# Patient Record
Sex: Female | Born: 1980 | Race: Asian | Hispanic: No | Marital: Married | Smoking: Never smoker
Health system: Southern US, Community
[De-identification: ages and names within clinical notes are randomized; demographics above are authoritative.]

## PROBLEM LIST (undated history)

## (undated) ENCOUNTER — Inpatient Hospital Stay (HOSPITAL_COMMUNITY): Payer: Self-pay

## (undated) DIAGNOSIS — M778 Other enthesopathies, not elsewhere classified: Secondary | ICD-10-CM

## (undated) DIAGNOSIS — N809 Endometriosis, unspecified: Secondary | ICD-10-CM

## (undated) DIAGNOSIS — M5430 Sciatica, unspecified side: Secondary | ICD-10-CM

## (undated) DIAGNOSIS — E559 Vitamin D deficiency, unspecified: Secondary | ICD-10-CM

## (undated) DIAGNOSIS — G5600 Carpal tunnel syndrome, unspecified upper limb: Secondary | ICD-10-CM

## (undated) DIAGNOSIS — N200 Calculus of kidney: Secondary | ICD-10-CM

## (undated) DIAGNOSIS — N3010 Interstitial cystitis (chronic) without hematuria: Secondary | ICD-10-CM

## (undated) HISTORY — PX: DILATION AND CURETTAGE OF UTERUS: SHX78

---
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
## 2001-06-22 ENCOUNTER — Emergency Department (HOSPITAL_COMMUNITY): Admission: EM | Admit: 2001-06-22 | Discharge: 2001-06-22 | Payer: Self-pay

## 2001-06-22 ENCOUNTER — Encounter: Payer: Self-pay | Admitting: Emergency Medicine

## 2005-04-04 ENCOUNTER — Ambulatory Visit (HOSPITAL_COMMUNITY): Admission: RE | Admit: 2005-04-04 | Discharge: 2005-04-04 | Payer: Self-pay | Admitting: Urology

## 2005-04-04 ENCOUNTER — Encounter (INDEPENDENT_AMBULATORY_CARE_PROVIDER_SITE_OTHER): Payer: Self-pay | Admitting: Specialist

## 2005-04-04 ENCOUNTER — Ambulatory Visit (HOSPITAL_BASED_OUTPATIENT_CLINIC_OR_DEPARTMENT_OTHER): Admission: RE | Admit: 2005-04-04 | Discharge: 2005-04-04 | Payer: Self-pay | Admitting: Urology

## 2005-05-22 ENCOUNTER — Ambulatory Visit: Payer: Self-pay | Admitting: Family Medicine

## 2005-06-05 ENCOUNTER — Ambulatory Visit: Payer: Self-pay | Admitting: Internal Medicine

## 2005-06-19 ENCOUNTER — Emergency Department (HOSPITAL_COMMUNITY): Admission: EM | Admit: 2005-06-19 | Discharge: 2005-06-20 | Payer: Self-pay | Admitting: Emergency Medicine

## 2008-02-10 ENCOUNTER — Encounter: Admission: RE | Admit: 2008-02-10 | Discharge: 2008-02-10 | Payer: Self-pay | Admitting: Family Medicine

## 2008-08-07 ENCOUNTER — Inpatient Hospital Stay (HOSPITAL_COMMUNITY): Admission: AD | Admit: 2008-08-07 | Discharge: 2008-08-07 | Payer: Self-pay | Admitting: Obstetrics and Gynecology

## 2008-08-07 ENCOUNTER — Ambulatory Visit: Payer: Self-pay | Admitting: Obstetrics and Gynecology

## 2009-02-05 ENCOUNTER — Encounter (INDEPENDENT_AMBULATORY_CARE_PROVIDER_SITE_OTHER): Payer: Self-pay | Admitting: Obstetrics and Gynecology

## 2009-02-05 ENCOUNTER — Ambulatory Visit (HOSPITAL_COMMUNITY): Admission: RE | Admit: 2009-02-05 | Discharge: 2009-02-05 | Payer: Self-pay | Admitting: Obstetrics and Gynecology

## 2009-03-17 ENCOUNTER — Ambulatory Visit: Payer: Self-pay | Admitting: Oncology

## 2009-03-22 ENCOUNTER — Ambulatory Visit (HOSPITAL_COMMUNITY): Admission: RE | Admit: 2009-03-22 | Discharge: 2009-03-22 | Payer: Self-pay | Admitting: Obstetrics and Gynecology

## 2009-03-30 LAB — CBC & DIFF AND RETIC
BASO%: 1.3 % (ref 0.0–2.0)
EOS%: 1 % (ref 0.0–7.0)
HCT: 40.1 % (ref 34.8–46.6)
MCH: 30.7 pg (ref 25.1–34.0)
MCHC: 33.7 g/dL (ref 31.5–36.0)
MCV: 91.1 fL (ref 79.5–101.0)
MONO%: 6.8 % (ref 0.0–14.0)
NEUT%: 51.5 % (ref 38.4–76.8)
Retic Ct Abs: 29.04 10*3/uL (ref 18.30–72.70)
lymph#: 2.5 10*3/uL (ref 0.9–3.3)

## 2009-03-30 LAB — MORPHOLOGY
PLT EST: ADEQUATE
RBC Comments: NORMAL

## 2009-04-01 LAB — BETA-2 GLYCOPROTEIN ANTIBODIES
Beta-2-Glycoprotein I IgA: 265 U/mL — ABNORMAL HIGH (ref <=15)
Beta-2-Glycoprotein I IgM: 26 U/mL — ABNORMAL HIGH (ref <=15)

## 2009-04-01 LAB — COMPREHENSIVE METABOLIC PANEL
AST: 16 U/L (ref 0–37)
Albumin: 5.1 g/dL (ref 3.5–5.2)
BUN: 7 mg/dL (ref 6–23)
CO2: 24 mEq/L (ref 19–32)
Calcium: 9.4 mg/dL (ref 8.4–10.5)
Chloride: 103 mEq/L (ref 96–112)
Creatinine, Ser: 0.84 mg/dL (ref 0.40–1.20)
Glucose, Bld: 85 mg/dL (ref 70–99)
Potassium: 3.6 mEq/L (ref 3.5–5.3)

## 2009-04-01 LAB — LACTATE DEHYDROGENASE: LDH: 166 U/L (ref 94–250)

## 2009-04-01 LAB — LUPUS ANTICOAGULANT PANEL
PTT Lupus Anticoagulant: 44.1 secs — ABNORMAL HIGH (ref 32.0–43.4)
PTTLA 4:1 Mix: 39 secs (ref 36.3–48.8)

## 2009-04-01 LAB — SEDIMENTATION RATE: Sed Rate: 21 mm/hr (ref 0–22)

## 2009-04-01 LAB — ANA: Anti Nuclear Antibody(ANA): NEGATIVE

## 2009-11-30 ENCOUNTER — Inpatient Hospital Stay (HOSPITAL_COMMUNITY): Admission: AD | Admit: 2009-11-30 | Discharge: 2009-11-30 | Payer: Self-pay | Admitting: Obstetrics and Gynecology

## 2010-06-07 ENCOUNTER — Ambulatory Visit (HOSPITAL_COMMUNITY)
Admission: RE | Admit: 2010-06-07 | Discharge: 2010-06-07 | Payer: Self-pay | Source: Home / Self Care | Attending: Obstetrics and Gynecology | Admitting: Obstetrics and Gynecology

## 2010-07-02 ENCOUNTER — Encounter: Payer: Self-pay | Admitting: Family Medicine

## 2010-07-09 ENCOUNTER — Emergency Department (HOSPITAL_COMMUNITY)
Admission: EM | Admit: 2010-07-09 | Discharge: 2010-07-09 | Payer: Self-pay | Source: Home / Self Care | Admitting: Emergency Medicine

## 2010-07-09 LAB — URINALYSIS, ROUTINE W REFLEX MICROSCOPIC
Bilirubin Urine: NEGATIVE
Ketones, ur: NEGATIVE mg/dL
Nitrite: NEGATIVE
Protein, ur: NEGATIVE mg/dL
Specific Gravity, Urine: 1.024 (ref 1.005–1.030)
Urine Glucose, Fasting: NEGATIVE mg/dL
Urobilinogen, UA: 0.2 mg/dL (ref 0.0–1.0)

## 2010-07-09 LAB — CBC
HCT: 40.8 % (ref 36.0–46.0)
MCHC: 34.3 g/dL (ref 30.0–36.0)
MCV: 91.3 fL (ref 78.0–100.0)
Platelets: 161 10*3/uL (ref 150–400)
RDW: 12.1 % (ref 11.5–15.5)

## 2010-07-09 LAB — WET PREP, GENITAL
Clue Cells Wet Prep HPF POC: NONE SEEN
Yeast Wet Prep HPF POC: NONE SEEN

## 2010-07-09 LAB — DIFFERENTIAL
Basophils Absolute: 0.1 10*3/uL (ref 0.0–0.1)
Basophils Relative: 1 % (ref 0–1)
Eosinophils Relative: 1 % (ref 0–5)
Lymphocytes Relative: 12 % (ref 12–46)
Monocytes Absolute: 0.7 10*3/uL (ref 0.1–1.0)
Monocytes Relative: 6 % (ref 3–12)
Neutro Abs: 10.5 10*3/uL — ABNORMAL HIGH (ref 1.7–7.7)
Neutrophils Relative %: 82 % — ABNORMAL HIGH (ref 43–77)

## 2010-07-09 LAB — COMPREHENSIVE METABOLIC PANEL
ALT: 24 U/L (ref 0–35)
Alkaline Phosphatase: 126 U/L — ABNORMAL HIGH (ref 39–117)
Calcium: 9.6 mg/dL (ref 8.4–10.5)
GFR calc Af Amer: >60 mL/min (ref >60)
GFR calc non Af Amer: >60 mL/min (ref >60)
Glucose, Bld: 97 mg/dL (ref 70–99)
Total Protein: 8.5 g/dL — ABNORMAL HIGH (ref 6.0–8.3)

## 2010-07-10 LAB — GC/CHLAMYDIA PROBE AMP, GENITAL: GC Probe Amp, Genital: NEGATIVE

## 2010-09-16 LAB — CBC
Hemoglobin: 12.1 g/dL (ref 12.0–15.0)
Platelets: 311 10*3/uL (ref 150–400)
RBC: 3.78 MIL/uL — ABNORMAL LOW (ref 3.87–5.11)

## 2010-09-26 LAB — CBC
HCT: 36.7 % (ref 36.0–46.0)
Platelets: 278 10*3/uL (ref 150–400)
RDW: 12.1 % (ref 11.5–15.5)
WBC: 8.8 10*3/uL (ref 4.0–10.5)

## 2010-10-24 NOTE — Op Note (Signed)
NAMESKARLETH, Ashley Farley                    ACCOUNT NO.:  [id number]   MEDICAL RECORD NO.:  [id number]          PATIENT TYPE:  AMB   LOCATION:  SDC                           FACILITY:  [facility]   PHYSICIAN:  Lenoard Aden, M.D.DATE OF BIRTH:  Oct 16, 1980   DATE OF PROCEDURE:  02/05/2009  DATE OF DISCHARGE:  02/05/2009                               OPERATIVE REPORT   PREOPERATIVE DIAGNOSES:  1. Missed abortion at 11 weeks.  2. Habitual aborter/recurrent pregnancy loss.   POSTOPERATIVE DIAGNOSES:  1. Missed abortion at 11 weeks.  2. Habitual aborter/recurrent pregnancy loss.   PROCEDURE:  Suction dilatation and evacuation, tissue sent for  chromosomal analysis.   SURGEON:  Lenoard Aden, M.D.   ANESTHESIA:  MAC, paracervical.   ESTIMATED BLOOD LOSS:  Less than 30 mL.   COMPLICATIONS:  None.   DRAINS:  None.   COUNTS:  Correct.   The patient was taken to recovery in good condition.   BRIEF OPERATIVE NOTE:  After being apprised of risks of anesthesia,  infection, bleeding, intrauterine perforation, injury to abdominal  organs and possible need for repair, the patient was brought to the  operating room where she was administered IV sedation without  difficulty, prepped and draped in the usual sterile fashion,  catheterized till the bladder was empty.  Feet were placed in the  Yellofin stirrups.  After achieving adequate anesthesia, dilute  lidocaine solution was placed in standard paracervical block, 20 mL  total.  After achieving further anesthesia, the cervix was easily  dilated up to a #25 Pratt dilator and 8-mm suction curette placed.  Aspiration  revealed products of conception, which were visualized, collected, and  sent for chromosomal analysis.  A repeat suction and then curettage in a  four-quadrant method confirmed the cavity to be empty.  Good hemostasis  was noted.  The patient tolerated the procedure well and was transferred  to recovery in good condition.      Lenoard Aden, M.D.  Electronically Signed     RJT/MEDQ  D:  02/05/2009  T:  02/06/2009  Job:  [id number]

## 2010-11-24 ENCOUNTER — Other Ambulatory Visit (HOSPITAL_COMMUNITY): Payer: Self-pay | Admitting: Gynecology

## 2010-11-24 DIAGNOSIS — N979 Female infertility, unspecified: Secondary | ICD-10-CM

## 2010-11-28 ENCOUNTER — Ambulatory Visit (HOSPITAL_COMMUNITY)
Admission: RE | Admit: 2010-11-28 | Discharge: 2010-11-28 | Disposition: A | Discharge status: Home / Self Care | Payer: BC Managed Care – PPO | Source: Ambulatory Visit | Attending: Gynecology | Admitting: Gynecology

## 2010-11-28 DIAGNOSIS — N949 Unspecified condition associated with female genital organs and menstrual cycle: Secondary | ICD-10-CM | POA: Insufficient documentation

## 2010-11-28 DIAGNOSIS — N979 Female infertility, unspecified: Secondary | ICD-10-CM

## 2011-01-15 ENCOUNTER — Inpatient Hospital Stay (HOSPITAL_COMMUNITY): Admission: RE | Admit: 2011-01-15 | Payer: BC Managed Care – PPO | Source: Ambulatory Visit

## 2011-01-18 ENCOUNTER — Encounter (HOSPITAL_COMMUNITY)
Admission: RE | Admit: 2011-01-18 | Discharge: 2011-01-18 | Disposition: A | Discharge status: Home / Self Care | Payer: BC Managed Care – PPO | Source: Ambulatory Visit | Attending: Obstetrics and Gynecology | Admitting: Obstetrics and Gynecology

## 2011-01-18 ENCOUNTER — Encounter (HOSPITAL_COMMUNITY): Payer: Self-pay

## 2011-01-18 HISTORY — DX: Interstitial cystitis (chronic) without hematuria: N30.10

## 2011-01-18 HISTORY — DX: Calculus of kidney: N20.0

## 2011-01-18 LAB — SURGICAL PCR SCREEN
MRSA, PCR: NEGATIVE
Staphylococcus aureus: NEGATIVE

## 2011-01-18 LAB — CBC
MCHC: 33 g/dL (ref 30.0–36.0)
RDW: 12.4 % (ref 11.5–15.5)
WBC: 10.6 10*3/uL — ABNORMAL HIGH (ref 4.0–10.5)

## 2011-01-18 NOTE — Patient Instructions (Signed)
20 Ashley Farley  01/18/2011   Your procedure is scheduled on:  01/22/11  Report to [facility] at 0600 AM. Desk phone - [phone]  Call this number if you have problems the morning of surgery: [phone]   Remember:   Do not eat food:After Midnight.  Do not drink clear liquids: After Midnight.  Take these medicines the morning of surgery with A SIP OF WATER: none   Do not wear jewelry, make-up or nail polish.  Do not wear lotions, powders, or perfumes. You may wear deodorant.  Do not shave 48 hours prior to surgery.  Do not bring valuables to the hospital.  Contacts, dentures or bridgework may not be worn into surgery.  Leave suitcase in the car. After surgery it may be brought to your room.  For patients admitted to the hospital, checkout time is 11:00 AM the day of discharge.   Patients discharged the day of surgery will not be allowed to drive home.  Name and phone number of your driver: spouse- [phone]  Special Instructions: CHG Shower Use Special Wash: 1/2 bottle night before surgery and 1/2 bottle morning of surgery.   Please read over the following fact sheets that you were given:

## 2011-01-18 NOTE — Anesthesia Preprocedure Evaluation (Signed)
Anesthesia Evaluation  Name, MR# and DOB Patient awake  General Assessment Comment  Reviewed: Allergy & Precautions, H&P , Patient's Chart, lab work & pertinent test results, reviewed documented beta blocker date and time   History of Anesthesia Complications Negative for: history of anesthetic complications  Airway Mallampati: II TM Distance: >3 FB Neck ROM: full    Dental No notable dental hx.    Pulmonary  clear to auscultation  pulmonary exam normalPulmonary Exam Normal breath sounds clear to auscultation none    Cardiovascular Exercise Tolerance: Good regular Normal    Neuro/Psych Negative Neurological ROS  Negative Psych ROS  GI/Hepatic/Renal negative GI ROS, negative Liver ROS, and negative Renal ROS (+)       Endo/Other  Negative Endocrine ROS (+)      Abdominal   Musculoskeletal   Hematology negative hematology ROS (+)   Peds  Reproductive/Obstetrics negative OB ROS    Anesthesia Other Findings             Anesthesia Physical Anesthesia Plan  ASA: I  Anesthesia Plan: General   Post-op Pain Management:    Induction:   Airway Management Planned:   Additional Equipment:   Intra-op Plan:   Post-operative Plan:   Informed Consent: I have reviewed the patients History and Physical, chart, labs and discussed the procedure including the risks, benefits and alternatives for the proposed anesthesia with the patient or authorized representative who has indicated his/her understanding and acceptance.   Dental Advisory Given  Plan Discussed with: CRNA and Surgeon  Anesthesia Plan Comments:         Anesthesia Quick Evaluation

## 2011-01-22 ENCOUNTER — Encounter (HOSPITAL_COMMUNITY)
Admission: RE | Disposition: A | Discharge status: Home / Self Care | Payer: Self-pay | Source: Ambulatory Visit | Attending: Obstetrics and Gynecology

## 2011-01-22 ENCOUNTER — Ambulatory Visit (HOSPITAL_COMMUNITY): Payer: BC Managed Care – PPO | Admitting: Anesthesiology

## 2011-01-22 ENCOUNTER — Encounter (HOSPITAL_COMMUNITY): Payer: Self-pay | Admitting: Anesthesiology

## 2011-01-22 ENCOUNTER — Ambulatory Visit (HOSPITAL_COMMUNITY)
Admission: RE | Admit: 2011-01-22 | Discharge: 2011-01-22 | Disposition: A | Discharge status: Home / Self Care | Payer: BC Managed Care – PPO | Source: Ambulatory Visit | Attending: Obstetrics and Gynecology | Admitting: Obstetrics and Gynecology

## 2011-01-22 DIAGNOSIS — N803 Endometriosis of pelvic peritoneum, unspecified: Secondary | ICD-10-CM | POA: Insufficient documentation

## 2011-01-22 DIAGNOSIS — N979 Female infertility, unspecified: Secondary | ICD-10-CM | POA: Insufficient documentation

## 2011-01-22 DIAGNOSIS — Z01818 Encounter for other preprocedural examination: Secondary | ICD-10-CM | POA: Insufficient documentation

## 2011-01-22 DIAGNOSIS — N801 Endometriosis of ovary: Secondary | ICD-10-CM | POA: Insufficient documentation

## 2011-01-22 DIAGNOSIS — Z01812 Encounter for preprocedural laboratory examination: Secondary | ICD-10-CM | POA: Insufficient documentation

## 2011-01-22 DIAGNOSIS — N946 Dysmenorrhea, unspecified: Secondary | ICD-10-CM | POA: Insufficient documentation

## 2011-01-22 DIAGNOSIS — N80109 Endometriosis of ovary, unspecified side, unspecified depth: Secondary | ICD-10-CM | POA: Insufficient documentation

## 2011-01-22 HISTORY — PX: LAPAROSCOPY: SHX197

## 2011-01-22 LAB — HCG, QUANTITATIVE, PREGNANCY: hCG, Beta Chain, Quant, S: 1 m[IU]/mL (ref <5)

## 2011-01-22 SURGERY — LAPAROSCOPY, DIAGNOSTIC
Anesthesia: General

## 2011-01-22 MED ORDER — PROPOFOL 10 MG/ML IV EMUL
INTRAVENOUS | Status: DC | PRN
  Administered 2011-01-22: 150 mg via INTRAVENOUS

## 2011-01-22 MED ORDER — FENTANYL CITRATE 0.05 MG/ML IJ SOLN: 25.0000 ug | INTRAMUSCULAR | Status: DC | PRN

## 2011-01-22 MED ORDER — HYDROMORPHONE HCL 2 MG PO TABS: 2.0000 mg | ORAL_TABLET | ORAL | Status: AC | PRN

## 2011-01-22 MED ORDER — KETOROLAC TROMETHAMINE 30 MG/ML IJ SOLN
INTRAMUSCULAR | Status: AC
  Filled 2011-01-22: qty 1

## 2011-01-22 MED ORDER — NEOSTIGMINE METHYLSULFATE 1 MG/ML IJ SOLN
INTRAMUSCULAR | Status: DC | PRN
  Administered 2011-01-22: 3 mg via INTRAMUSCULAR

## 2011-01-22 MED ORDER — BUPIVACAINE HCL (PF) 0.25 % IJ SOLN
INTRAMUSCULAR | Status: DC | PRN
  Administered 2011-01-22: 10 mL

## 2011-01-22 MED ORDER — LACTATED RINGERS IV SOLN
INTRAVENOUS | Status: DC
  Administered 2011-01-22 (×2): via INTRAVENOUS

## 2011-01-22 MED ORDER — MIDAZOLAM HCL 5 MG/5ML IJ SOLN
INTRAMUSCULAR | Status: DC | PRN
  Administered 2011-01-22: 2 mg via INTRAVENOUS

## 2011-01-22 MED ORDER — CLINDAMYCIN PHOSPHATE 900 MG/50ML IV SOLN
900.0000 mg | Freq: Once | INTRAVENOUS | Status: AC
  Administered 2011-01-22: 900 mg via INTRAVENOUS
  Filled 2011-01-22: qty 50

## 2011-01-22 MED ORDER — KETOROLAC TROMETHAMINE 30 MG/ML IJ SOLN: 15.0000 mg | Freq: Once | INTRAMUSCULAR | Status: DC | PRN

## 2011-01-22 MED ORDER — LIDOCAINE HCL (CARDIAC) 20 MG/ML IV SOLN
INTRAVENOUS | Status: AC
  Filled 2011-01-22: qty 5

## 2011-01-22 MED ORDER — FENTANYL CITRATE 0.05 MG/ML IJ SOLN
INTRAMUSCULAR | Status: AC
  Filled 2011-01-22: qty 5

## 2011-01-22 MED ORDER — KETOROLAC TROMETHAMINE 30 MG/ML IJ SOLN
INTRAMUSCULAR | Status: DC | PRN
  Administered 2011-01-22: 30 mg via INTRAVENOUS

## 2011-01-22 MED ORDER — ONDANSETRON HCL 4 MG/2ML IJ SOLN
INTRAMUSCULAR | Status: DC | PRN
  Administered 2011-01-22: 4 mg via INTRAVENOUS

## 2011-01-22 MED ORDER — ONDANSETRON HCL 4 MG/2ML IJ SOLN
INTRAMUSCULAR | Status: AC
  Filled 2011-01-22: qty 2

## 2011-01-22 MED ORDER — MIDAZOLAM HCL 2 MG/2ML IJ SOLN
INTRAMUSCULAR | Status: AC
  Filled 2011-01-22: qty 2

## 2011-01-22 MED ORDER — GLYCOPYRROLATE 0.2 MG/ML IJ SOLN
INTRAMUSCULAR | Status: AC
  Filled 2011-01-22: qty 2

## 2011-01-22 MED ORDER — PROMETHAZINE HCL 25 MG/ML IJ SOLN: 6.2500 mg | INTRAMUSCULAR | Status: DC | PRN

## 2011-01-22 MED ORDER — LIDOCAINE HCL (CARDIAC) 20 MG/ML IV SOLN
INTRAVENOUS | Status: DC | PRN
  Administered 2011-01-22: 50 mg via INTRAVENOUS

## 2011-01-22 MED ORDER — ROCURONIUM BROMIDE 100 MG/10ML IV SOLN
INTRAVENOUS | Status: DC | PRN
  Administered 2011-01-22: 30 mg via INTRAVENOUS

## 2011-01-22 MED ORDER — ACETAMINOPHEN 325 MG PO TABS: 325.0000 mg | ORAL_TABLET | ORAL | Status: DC | PRN

## 2011-01-22 MED ORDER — PROPOFOL 10 MG/ML IV EMUL
INTRAVENOUS | Status: AC
  Filled 2011-01-22: qty 50

## 2011-01-22 MED ORDER — NEOSTIGMINE METHYLSULFATE 1 MG/ML IJ SOLN
INTRAMUSCULAR | Status: AC
  Filled 2011-01-22: qty 10

## 2011-01-22 MED ORDER — ROCURONIUM BROMIDE 50 MG/5ML IV SOLN
INTRAVENOUS | Status: AC
  Filled 2011-01-22: qty 1

## 2011-01-22 MED ORDER — FENTANYL CITRATE 0.05 MG/ML IJ SOLN
INTRAMUSCULAR | Status: DC | PRN
  Administered 2011-01-22: 25 ug via INTRAVENOUS
  Administered 2011-01-22: 100 ug via INTRAVENOUS

## 2011-01-22 MED ORDER — GLYCOPYRROLATE 0.2 MG/ML IJ SOLN
INTRAMUSCULAR | Status: DC | PRN
  Administered 2011-01-22: .6 mg via INTRAVENOUS

## 2011-01-22 SURGICAL SUPPLY — 25 items
APPLICATOR COTTON TIP 6IN STRL (MISCELLANEOUS) ×4 IMPLANT
CABLE HIGH FREQUENCY MONO STRZ (ELECTRODE) IMPLANT
CATH ROBINSON RED A/P 16FR (CATHETERS) ×2 IMPLANT
CHLORAPREP W/TINT 26ML (MISCELLANEOUS) ×4 IMPLANT
CLOTH BEACON ORANGE TIMEOUT ST (SAFETY) ×2 IMPLANT
DERMABOND ADVANCED (GAUZE/BANDAGES/DRESSINGS) ×2 IMPLANT
GLOVE BIO SURGEON STRL SZ 6.5 (GLOVE) ×2 IMPLANT
GLOVE BIOGEL PI IND STRL 7.0 (GLOVE) ×2 IMPLANT
GLOVE BIOGEL PI INDICATOR 7.0 (GLOVE) ×2
GOWN PREVENTION PLUS LG XLONG (DISPOSABLE) ×4 IMPLANT
NS IRRIG 1000ML POUR BTL (IV SOLUTION) ×2 IMPLANT
PACK LAPAROSCOPY BASIN (CUSTOM PROCEDURE TRAY) ×2 IMPLANT
SEALER TISSUE G2 CVD JAW 35 (ENDOMECHANICALS) IMPLANT
SEALER TISSUE G2 CVD JAW 45CM (ENDOMECHANICALS) IMPLANT
SET IRRIG TUBING LAPAROSCOPIC (IRRIGATION / IRRIGATOR) IMPLANT
SLEEVE Z-THREAD 5X100MM (TROCAR) IMPLANT
STRIP CLOSURE SKIN 1/2X4 (GAUZE/BANDAGES/DRESSINGS) ×2 IMPLANT
SUT VIC AB 3-0 PS2 18 (SUTURE) ×2
SUT VIC AB 3-0 PS2 18XBRD (SUTURE) ×1 IMPLANT
SUT VICRYL 0 UR6 27IN ABS (SUTURE) ×2 IMPLANT
TOWEL OR 17X24 6PK STRL BLUE (TOWEL DISPOSABLE) ×4 IMPLANT
TROCAR Z-THREAD BLADED 5X100MM (TROCAR) ×2 IMPLANT
TROCAR Z-THREAD FIOS 11X100 BL (TROCAR) ×2 IMPLANT
WARMER LAPAROSCOPE (MISCELLANEOUS) ×2 IMPLANT
WATER STERILE IRR 1000ML POUR (IV SOLUTION) ×2 IMPLANT

## 2011-01-22 NOTE — Anesthesia Postprocedure Evaluation (Signed)
  Anesthesia Post Note  Patient: Ashley Farley  Procedure(s) Performed:  LAPAROSCOPY DIAGNOSTIC  Anesthesia type: GA  Patient location: PACU  Post pain: Pain level controlled  Post assessment: Post-op Vital signs reviewed  Last Vitals:  Filed Vitals:   01/22/11 0619  BP: 93/56  Pulse: 59  Temp: 98.1 F (36.7 C)  Resp: 16    Post vital signs: Reviewed  Level of consciousness: sedated  Complications: No apparent anesthesia complications

## 2011-01-22 NOTE — H&P (Signed)
30 yo G4P0 with dysmenorrhea and infertility presents for diagnostic laparoscopy  PMHx/PSHx:  Neg All:  Amoxicillin, hydrocodone FHx:  N/C Meds: none  AF, VSS Gen:  NAD CV:  RRR Lungs:  CTAB Abd: soft, NT PV: neg for masses  HSG:  WNL  A/P:  Dysmenorrhea, Infertility Plan for diagnostic Laparoscopy.  R/B/A d/w pt.  Informed consent

## 2011-01-22 NOTE — Transfer of Care (Signed)
Immediate Anesthesia Transfer of Care Note  Patient: Ashley Farley  Procedure(s) Performed:  LAPAROSCOPY DIAGNOSTIC  Patient Location: PACU  Anesthesia Type: General  Level of Consciousness: awake, alert  and sedated  Airway & Oxygen Therapy: Patient Spontanous Breathing and Patient connected to nasal cannula oxygen  Post-op Assessment: Report given to PACU RN and Post -op Vital signs reviewed and stable  Post vital signs: Reviewed and stable  Complications: No apparent anesthesia complications

## 2011-01-22 NOTE — Anesthesia Procedure Notes (Signed)
Procedure Name: Intubation Performed by: Madison Hickman Pre-anesthesia Checklist: Patient identified, Emergency Drugs available, Suction available, Patient being monitored and Timeout performed Patient Re-evaluated:Patient Re-evaluated prior to inductionOxygen Delivery Method: Circle System Utilized Preoxygenation: Pre-oxygenation with 100% oxygen Intubation Type: IV induction Ventilation: Mask ventilation without difficulty Laryngoscope Size: Mac and 3 Grade View: Grade I Tube type: Oral Tube size: 7.0 mm Number of attempts: 1 Airway Equipment and Method: stylet Placement Confirmation: ETT inserted through vocal cords under direct vision,  breath sounds checked- equal and bilateral and positive ETCO2 Secured at: 22 cm Tube secured with: Tape Dental Injury: Teeth and Oropharynx as per pre-operative assessment

## 2011-01-22 NOTE — Op Note (Signed)
Diagnostic Laparoscopy Procedure Note  Indications: The patient is a 30 y.o. female with dysmenorrhea and infertility.  Pre-operative Diagnosis: dysmenorrhea  Post-operative Diagnosis: endometriosis  Surgeon: Amanii Snethen   Anesthesia: General endotracheal anesthesia  Procedure:  Diagnostic laparoscopy, lysis of adhesions, fulgeration of endometriosis  Procedure Details  The patient was seen in the Holding Room. The risks, benefits, complications, treatment options, and expected outcomes were discussed with the patient. The possibilities of reaction to medication, pulmonary aspiration, perforation of viscus, bleeding, recurrent infection, the need for additional procedures, failure to diagnose a condition, and creating a complication requiring transfusion or operation were discussed with the patient. The patient concurred with the proposed plan, giving informed consent. The patient was taken to the Operating Room, identified as Ashley Farley and the procedure verified as Diagnostic Laparoscopy. A Time Out was held and the above information confirmed.  After induction of general anesthesia, the patient was placed in modified dorsal lithotomy position where she was prepped, draped, and catheterized in the normal, sterile fashion.  The cervix was visualized and an intrauterine manipulator was placed. 5cc of Marcaine was used to inject local anesthesia at the site of both incisions.  An infraumbilical skin incision was then performed and this was disected bluntly to the fascia with a kelly clamp.  Optical trocar was then inserted and CO2 was turned on once intraperitoneal placement was confirmed.  A survey of the abdomen and pelvis revealed a normal RUQ and appendix.  5mm incision was then made 2cm above the pubic bone and a 5mm trocar was inserted under direct visualization.     Normal appearing right adnexa and uterus.  She had a small brown lesion of endometriosis in the right ovarian fossa.   Left fallopian tube appeared normal.  The left ovary had thin, filmy adhesions to the ovarian fossa that were freed with blunt disection.  Lesions of endometriosis were also identified along the peritoneum over the left ureter and right vesicouterine peritoneum.  The lesions in the right & left ovarian fossa were cauterized with the bipolar.  The lesions along the peritoneum overlying the left ureter were not cauterized.     The incision was closed with subcutaneous and subcuticular sutures of 4-0 Vicryl.  Dermabond was placed over incisions.  The intrauterine manipulator was then removed.  Instrument, sponge, and needle counts were correct prior to abdominal closure and at the conclusion of the case.   Estimated Blood Loss:  Minimal         Specimens: none              Complications:  None; patient tolerated the procedure well.         Disposition: PACU - hemodynamically stable.         Condition: stable

## 2011-02-19 ENCOUNTER — Encounter (HOSPITAL_COMMUNITY): Payer: Self-pay | Admitting: Obstetrics and Gynecology

## 2013-06-24 ENCOUNTER — Encounter: Payer: Self-pay | Admitting: Obstetrics and Gynecology

## 2013-06-24 LAB — US OB COMP LESS 14 WKS

## 2013-07-07 ENCOUNTER — Encounter (HOSPITAL_COMMUNITY): Payer: Self-pay

## 2013-07-07 ENCOUNTER — Ambulatory Visit (HOSPITAL_COMMUNITY)
Admission: RE | Admit: 2013-07-07 | Discharge: 2013-07-07 | Disposition: A | Discharge status: Home / Self Care | Payer: BC Managed Care – PPO | Source: Ambulatory Visit | Attending: Obstetrics and Gynecology | Admitting: Obstetrics and Gynecology

## 2013-07-07 DIAGNOSIS — O30009 Twin pregnancy, unspecified number of placenta and unspecified number of amniotic sacs, unspecified trimester: Secondary | ICD-10-CM | POA: Insufficient documentation

## 2013-07-07 DIAGNOSIS — O30049 Twin pregnancy, dichorionic/diamniotic, unspecified trimester: Secondary | ICD-10-CM | POA: Insufficient documentation

## 2013-07-07 DIAGNOSIS — O2620 Pregnancy care for patient with recurrent pregnancy loss, unspecified trimester: Secondary | ICD-10-CM

## 2013-07-07 DIAGNOSIS — M5430 Sciatica, unspecified side: Secondary | ICD-10-CM | POA: Insufficient documentation

## 2013-07-07 NOTE — Consult Note (Signed)
MFM consult  33 yr old G6P0050 at 9+ weeks with dichorionic/diamniotic twin gestation referred by Dr. Su Hiltoberts for consultation. This pregnancy was conceived via IVF. 2 embryos were placed with a frozen cycle. Patient had preimplantation genetic screening done which was normal. Had one episode of bleeding at 8 weeks. Currently has nausea. Also reports sciatic nerve pain.   Past Ob hx: 5 first trimester miscarriages- reports never had heart beat Past gyn hx: endometriosis; D&Cx2 PMH: interstitial cystitis, renal stones PSH: laparoscopy; D&C x2 Medications: PNV, crinone, aspirin, PNV, folic acid Allergies: oxycodone- rash, amoxicillin- rash Social history: negative Family history: parents with diabetes  I counseled the patient as follows: 1. Twin pregnancy: - discussed increased risk of miscarriage, preterm labor/PPROM/preterm cervical dilation, and preterm delivery- average age of delivery is 6635 weeks with dichorionic twins - recommend preterm labor precautions; consider obtaining cervical length at time of anatomy survey - discussed increased risk of fetal growth restriction- recommend fetal growth every 4 weeks starting at time of anatomic survey - discussed increased risk of maternal complications including gestational diabetes and preeclampsia as well as increased risk for need for C section - recommend close surveillance for development of signs/symptoms of preeclampsia 2. Recurrent pregnancy loss: - patient has received an extensive work up including uterine evaluation, thrombophilia work up, and karyotype of missed abortion- work up has been negative - given no clear etiology difficult to give risk of pregnancy loss - discussed risks decrease with advancing gestational age 823. Strong family history of diabetes: - recommend early glucola screen in the first trimester; if normal recommend repeat at 26-[redacted] weeks gestation - discussed increased risk of gestational diabetes with family  history and twin gestation 514. Discussed aneuploidy screening: - patient had PGS and declines further screening 5. Recommend a detailed fetal anatomic survey at 18-[redacted] weeks gestation 6. Recommend delivery at 38 weeks or sooner if clinically indicated 7. Nausea: - has not taken zofran - recommend trial of diclegis- prescription given - may take 2 tabs at bedtime - if symptoms persist during the day may add one tab in the morning and increase to 2 tabs bid as needed 8. Management of progesterone and aspirin per REI; patient also received lipid infusions during this pregnancy per REI  I spent a total of 30 minutes of which 100% was in face to face consultation with the patient discussing the above.  Eulis FosterKristen Inga Noller, MD

## 2013-07-08 NOTE — Addendum Note (Signed)
Encounter addended by: Yoshiaki Kreuser E Marigny Borre, RN on: 07/08/2013 11:51 AM<BR>     Documentation filed: Charges VN

## 2013-07-09 ENCOUNTER — Other Ambulatory Visit: Payer: Self-pay | Admitting: Obstetrics and Gynecology

## 2013-07-09 ENCOUNTER — Ambulatory Visit (HOSPITAL_COMMUNITY)
Admission: RE | Admit: 2013-07-09 | Discharge: 2013-07-09 | Disposition: A | Discharge status: Home / Self Care | Payer: BC Managed Care – PPO | Source: Ambulatory Visit | Attending: Obstetrics and Gynecology | Admitting: Obstetrics and Gynecology

## 2013-07-09 DIAGNOSIS — O209 Hemorrhage in early pregnancy, unspecified: Secondary | ICD-10-CM

## 2013-07-09 DIAGNOSIS — Z3689 Encounter for other specified antenatal screening: Secondary | ICD-10-CM | POA: Insufficient documentation

## 2013-07-09 DIAGNOSIS — O30009 Twin pregnancy, unspecified number of placenta and unspecified number of amniotic sacs, unspecified trimester: Secondary | ICD-10-CM | POA: Insufficient documentation

## 2013-09-09 ENCOUNTER — Other Ambulatory Visit: Payer: Self-pay | Admitting: Obstetrics and Gynecology

## 2013-09-09 ENCOUNTER — Ambulatory Visit (HOSPITAL_COMMUNITY)
Admission: RE | Admit: 2013-09-09 | Discharge: 2013-09-09 | Disposition: A | Discharge status: Home / Self Care | Payer: BC Managed Care – PPO | Source: Ambulatory Visit | Attending: Obstetrics and Gynecology | Admitting: Obstetrics and Gynecology

## 2013-09-09 DIAGNOSIS — O9989 Other specified diseases and conditions complicating pregnancy, childbirth and the puerperium: Secondary | ICD-10-CM

## 2013-09-09 DIAGNOSIS — R109 Unspecified abdominal pain: Secondary | ICD-10-CM

## 2013-09-09 DIAGNOSIS — R1031 Right lower quadrant pain: Secondary | ICD-10-CM | POA: Insufficient documentation

## 2013-09-09 DIAGNOSIS — O4410 Placenta previa with hemorrhage, unspecified trimester: Secondary | ICD-10-CM

## 2013-09-09 DIAGNOSIS — O30009 Twin pregnancy, unspecified number of placenta and unspecified number of amniotic sacs, unspecified trimester: Secondary | ICD-10-CM | POA: Insufficient documentation

## 2013-09-09 DIAGNOSIS — O99891 Other specified diseases and conditions complicating pregnancy: Secondary | ICD-10-CM | POA: Insufficient documentation

## 2013-11-26 ENCOUNTER — Encounter (HOSPITAL_COMMUNITY): Payer: Self-pay | Admitting: *Deleted

## 2013-11-26 ENCOUNTER — Inpatient Hospital Stay (HOSPITAL_COMMUNITY)
Admission: AD | Admit: 2013-11-26 | Discharge: 2013-11-26 | Disposition: A | Discharge status: Home / Self Care | Payer: BC Managed Care – PPO | Source: Ambulatory Visit | Attending: Obstetrics and Gynecology | Admitting: Obstetrics and Gynecology

## 2013-11-26 DIAGNOSIS — O30049 Twin pregnancy, dichorionic/diamniotic, unspecified trimester: Secondary | ICD-10-CM | POA: Insufficient documentation

## 2013-11-26 DIAGNOSIS — IMO0001 Reserved for inherently not codable concepts without codable children: Secondary | ICD-10-CM | POA: Diagnosis present

## 2013-11-26 DIAGNOSIS — O26879 Cervical shortening, unspecified trimester: Secondary | ICD-10-CM | POA: Diagnosis present

## 2013-11-26 DIAGNOSIS — O09819 Supervision of pregnancy resulting from assisted reproductive technology, unspecified trimester: Secondary | ICD-10-CM | POA: Insufficient documentation

## 2013-11-26 DIAGNOSIS — Z8742 Personal history of other diseases of the female genital tract: Secondary | ICD-10-CM

## 2013-11-26 DIAGNOSIS — O30009 Twin pregnancy, unspecified number of placenta and unspecified number of amniotic sacs, unspecified trimester: Secondary | ICD-10-CM | POA: Diagnosis not present

## 2013-11-26 DIAGNOSIS — N200 Calculus of kidney: Secondary | ICD-10-CM | POA: Diagnosis not present

## 2013-11-26 DIAGNOSIS — N883 Incompetence of cervix uteri: Secondary | ICD-10-CM | POA: Diagnosis present

## 2013-11-26 HISTORY — DX: Sciatica, unspecified side: M54.30

## 2013-11-26 HISTORY — DX: Carpal tunnel syndrome, unspecified upper limb: G56.00

## 2013-11-26 MED ORDER — BETAMETHASONE SOD PHOS & ACET 6 (3-3) MG/ML IJ SUSP
12.0000 mg | INTRAMUSCULAR | Status: DC
  Administered 2013-11-26: 12 mg via INTRAMUSCULAR
  Filled 2013-11-26: qty 2

## 2013-11-26 NOTE — MAU Provider Note (Signed)
History   33 yo G1P0 at 4830 4/7 weeks with di/di twins, hx IVF pregnancy, with short cervix on US today, 1.36 cm.  Cervix closed by exam.  Cultures and GBS done in office.  No FFN done due to prior transvaginal US.  Reports mild pressure sensations today, no bleeding or leaking, reports +FM.  US today showed Twin A vtx, EFW 1590 gm, Twin B transverse, 1707 gm, with cervix 1.36 cm long.  Here for monitoring and initiation of betamethasone course.  Patient Active Problem List   Diagnosis Date Noted  . Twins--di/di 11/26/2013  . Short cervix--1.36 on US today 11/26/2013  . Kidney stones--hx 11/26/2013  . H/O infertility--IVF pregnancy 11/26/2013    Chief Complaint  Patient presents with  . Labor Eval   HPI  OB History   Grav Para Term Preterm Abortions TAB SAB Ect Mult Living   1               Past Medical History  Diagnosis Date  . Interstitial cystitis   . Kidney stones     Past Surgical History  Procedure Laterality Date  . Dilation and curettage of uterus    . Laparoscopy  01/22/2011    Procedure: LAPAROSCOPY DIAGNOSTIC;  Surgeon: Zelphia CairoGretchen Adkins;  Location: [facility];  Service: Gynecology;  Laterality: N/A;    No family history on file.  History  Substance Use Topics  . Smoking status: Never Smoker   . Smokeless tobacco: Not on file  . Alcohol Use: Yes     Comment: socially    Allergies:  Allergies  Allergen Reactions  . Amoxicillin Nausea And Vomiting and Rash  . Vicodin [Hydrocodone-Acetaminophen] Nausea And Vomiting and Rash    Prescriptions prior to admission  Medication Sig Dispense Refill  . benzonatate (TESSALON) 100 MG capsule Take 100-200 mg by mouth 3 (three) times daily as needed. As needed for cough        . Cetirizine HCl (ZYRTEC ALLERGY PO) Take 1 tablet by mouth every other day. For allergy       . Misc Natural Products (COLON CLEANSER PO) Take 2 capsules by mouth 2 (two) times daily.        . Pseudoeph-Chlorphen-Hydrocod (ZUTRIPRO) 60-4-5  MG/5ML SOLN Take 5 mLs by mouth every 6 (six) hours as needed. As needed for cough         ROS:  Occasional pelvic pressure, +FM Physical Exam   There were no vitals taken for this visit.  Physical Exam Chest clear Heart RRR without murmur Abd gravid, NT Pelvic--deferred at present, closed at office Ext WNL  FHR Reassuring on initial 5 min tracing Single UC noted at present  ED Course  Assessment: Twin IUP (di/di) at 1430 4/7 weeks Short cervix on US  Plan: Monitor for contractions Betamethasone course inititated. Will consult with Dr. Stefano GaulStringer when uterine activity assessed.   Ashley BridgemanLATHAM, Ashley CNM, MSN 11/26/2013 6:13 PM  Addendum: No UCs in 1 hour of monitoring. FHR Category 1 x 2 Received initial betamethasone dose around 6p.  Consulted with Dr. Stefano GaulStringer. D/C home to continue modified bedrest. Rx Prometrium 200 mg per vagina q hs (will send Rx via [employer]) S/S PTL reviewed with patient and husband. Keep scheduled appt at [facility] next week.  Ashley BridgemanVicki Farley, CNM 11/26/13 8p

## 2013-11-26 NOTE — MAU Note (Signed)
Sent from [facility] to MAU for further evaluation; ultrasound showed shortened cervix today;

## 2013-11-27 ENCOUNTER — Inpatient Hospital Stay (HOSPITAL_COMMUNITY)
Admission: AD | Admit: 2013-11-27 | Discharge: 2013-11-27 | Disposition: A | Discharge status: Home / Self Care | Payer: BC Managed Care – PPO | Source: Ambulatory Visit | Attending: Obstetrics and Gynecology | Admitting: Obstetrics and Gynecology

## 2013-11-27 DIAGNOSIS — O4700 False labor before 37 completed weeks of gestation, unspecified trimester: Secondary | ICD-10-CM | POA: Insufficient documentation

## 2013-11-27 MED ORDER — BETAMETHASONE SOD PHOS & ACET 6 (3-3) MG/ML IJ SUSP
12.0000 mg | Freq: Once | INTRAMUSCULAR | Status: AC
  Administered 2013-11-27: 12 mg via INTRAMUSCULAR
  Filled 2013-11-27: qty 2

## 2013-12-16 ENCOUNTER — Inpatient Hospital Stay (HOSPITAL_COMMUNITY): Payer: BC Managed Care – PPO

## 2013-12-16 ENCOUNTER — Encounter (HOSPITAL_COMMUNITY): Payer: Self-pay | Admitting: *Deleted

## 2013-12-16 ENCOUNTER — Inpatient Hospital Stay (HOSPITAL_COMMUNITY)
Admission: AD | Admit: 2013-12-16 | Discharge: 2013-12-26 | DRG: 765 | Disposition: A | Discharge status: Home / Self Care | Payer: BC Managed Care – PPO | Source: Ambulatory Visit | Attending: Obstetrics and Gynecology | Admitting: Obstetrics and Gynecology

## 2013-12-16 DIAGNOSIS — O9912 Other diseases of the blood and blood-forming organs and certain disorders involving the immune mechanism complicating childbirth: Secondary | ICD-10-CM

## 2013-12-16 DIAGNOSIS — O328XX0 Maternal care for other malpresentation of fetus, not applicable or unspecified: Secondary | ICD-10-CM | POA: Diagnosis present

## 2013-12-16 DIAGNOSIS — Z823 Family history of stroke: Secondary | ICD-10-CM

## 2013-12-16 DIAGNOSIS — D649 Anemia, unspecified: Secondary | ICD-10-CM | POA: Diagnosis present

## 2013-12-16 DIAGNOSIS — O9903 Anemia complicating the puerperium: Secondary | ICD-10-CM | POA: Diagnosis present

## 2013-12-16 DIAGNOSIS — O09819 Supervision of pregnancy resulting from assisted reproductive technology, unspecified trimester: Secondary | ICD-10-CM

## 2013-12-16 DIAGNOSIS — Z87442 Personal history of urinary calculi: Secondary | ICD-10-CM

## 2013-12-16 DIAGNOSIS — O30049 Twin pregnancy, dichorionic/diamniotic, unspecified trimester: Secondary | ICD-10-CM

## 2013-12-16 DIAGNOSIS — O1414 Severe pre-eclampsia complicating childbirth: Principal | ICD-10-CM | POA: Diagnosis present

## 2013-12-16 DIAGNOSIS — Z8249 Family history of ischemic heart disease and other diseases of the circulatory system: Secondary | ICD-10-CM

## 2013-12-16 DIAGNOSIS — O26839 Pregnancy related renal disease, unspecified trimester: Secondary | ICD-10-CM | POA: Diagnosis present

## 2013-12-16 DIAGNOSIS — D689 Coagulation defect, unspecified: Secondary | ICD-10-CM | POA: Diagnosis present

## 2013-12-16 DIAGNOSIS — Z833 Family history of diabetes mellitus: Secondary | ICD-10-CM

## 2013-12-16 DIAGNOSIS — O30009 Twin pregnancy, unspecified number of placenta and unspecified number of amniotic sacs, unspecified trimester: Secondary | ICD-10-CM | POA: Diagnosis present

## 2013-12-16 DIAGNOSIS — D696 Thrombocytopenia, unspecified: Secondary | ICD-10-CM | POA: Diagnosis present

## 2013-12-16 DIAGNOSIS — O9902 Anemia complicating childbirth: Secondary | ICD-10-CM | POA: Diagnosis present

## 2013-12-16 DIAGNOSIS — Z8349 Family history of other endocrine, nutritional and metabolic diseases: Secondary | ICD-10-CM

## 2013-12-16 DIAGNOSIS — IMO0001 Reserved for inherently not codable concepts without codable children: Secondary | ICD-10-CM | POA: Diagnosis present

## 2013-12-16 DIAGNOSIS — O1490 Unspecified pre-eclampsia, unspecified trimester: Secondary | ICD-10-CM | POA: Diagnosis present

## 2013-12-16 LAB — COMPREHENSIVE METABOLIC PANEL
ALK PHOS: 173 U/L — AB (ref 39–117)
ALT: 14 U/L (ref 0–35)
AST: 26 U/L (ref 0–37)
Albumin: 2.2 g/dL — ABNORMAL LOW (ref 3.5–5.2)
Anion gap: 12 (ref 5–15)
BUN: 12 mg/dL (ref 6–23)
CO2: 19 meq/L (ref 19–32)
Calcium: 9.5 mg/dL (ref 8.4–10.5)
Chloride: 105 mEq/L (ref 96–112)
Creatinine, Ser: 0.79 mg/dL (ref 0.50–1.10)
GLUCOSE: 106 mg/dL — AB (ref 70–99)
POTASSIUM: 4.2 meq/L (ref 3.7–5.3)
SODIUM: 136 meq/L — AB (ref 137–147)
Total Bilirubin: <0.2 mg/dL — ABNORMAL LOW (ref 0.3–1.2)
Total Protein: 5.7 g/dL — ABNORMAL LOW (ref 6.0–8.3)

## 2013-12-16 LAB — CBC
HEMATOCRIT: 38.2 % (ref 36.0–46.0)
HEMOGLOBIN: 13.2 g/dL (ref 12.0–15.0)
MCH: 33 pg (ref 26.0–34.0)
MCHC: 34.6 g/dL (ref 30.0–36.0)
MCV: 95.5 fL (ref 78.0–100.0)
Platelets: 132 10*3/uL — ABNORMAL LOW (ref 150–400)
RBC: 4 MIL/uL (ref 3.87–5.11)
RDW: 13.6 % (ref 11.5–15.5)
WBC: 8.9 10*3/uL (ref 4.0–10.5)

## 2013-12-16 LAB — PROTEIN / CREATININE RATIO, URINE
Creatinine, Urine: 51.68 mg/dL
Protein Creatinine Ratio: 2.09 — ABNORMAL HIGH (ref 0.00–0.15)
Total Protein, Urine: 107.9 mg/dL

## 2013-12-16 LAB — GROUP B STREP BY PCR: Group B strep by PCR: NEGATIVE

## 2013-12-16 LAB — URIC ACID: Uric Acid, Serum: 9.4 mg/dL — ABNORMAL HIGH (ref 2.4–7.0)

## 2013-12-16 LAB — LACTATE DEHYDROGENASE: LDH: 193 U/L (ref 94–250)

## 2013-12-16 MED ORDER — DOCUSATE SODIUM 100 MG PO CAPS
100.0000 mg | ORAL_CAPSULE | Freq: Every day | ORAL | Status: DC
Start: 2013-12-16 — End: 2013-12-26
  Administered 2013-12-17 – 2013-12-26 (×5): 100 mg via ORAL
  Filled 2013-12-16 (×7): qty 1

## 2013-12-16 MED ORDER — ACETAMINOPHEN 325 MG PO TABS
650.0000 mg | ORAL_TABLET | Freq: Four times a day (QID) | ORAL | Status: DC | PRN
  Administered 2013-12-16 – 2013-12-18 (×3): 650 mg via ORAL
  Filled 2013-12-16 (×3): qty 2

## 2013-12-16 MED ORDER — PRENATAL MULTIVITAMIN CH
1.0000 | ORAL_TABLET | Freq: Every day | ORAL | Status: DC
  Administered 2013-12-16 – 2013-12-22 (×7): 1 via ORAL
  Filled 2013-12-16 (×7): qty 1

## 2013-12-16 MED ORDER — ASPIRIN 81 MG PO CHEW
81.0000 mg | CHEWABLE_TABLET | Freq: Every day | ORAL | Status: DC
  Administered 2013-12-16 – 2013-12-22 (×7): 81 mg via ORAL
  Filled 2013-12-16 (×9): qty 1

## 2013-12-16 MED ORDER — PROGESTERONE 200 MG VA SUPP
200.0000 mg | Freq: Every day | VAGINAL | Status: DC
  Filled 2013-12-16: qty 1

## 2013-12-16 MED ORDER — SODIUM CHLORIDE 0.9 % IJ SOLN
3.0000 mL | Freq: Two times a day (BID) | INTRAMUSCULAR | Status: DC
Start: 2013-12-16 — End: 2013-12-23
  Administered 2013-12-16 – 2013-12-22 (×12): 3 mL via INTRAVENOUS

## 2013-12-16 MED ORDER — FOLIC ACID 0.5 MG HALF TAB
0.5000 mg | ORAL_TABLET | Freq: Every day | ORAL | Status: DC
  Administered 2013-12-16 – 2013-12-22 (×7): 0.5 mg via ORAL
  Filled 2013-12-16 (×9): qty 1

## 2013-12-16 MED ORDER — CALCIUM CARBONATE ANTACID 500 MG PO CHEW
2.0000 | CHEWABLE_TABLET | ORAL | Status: DC | PRN
  Administered 2013-12-19 – 2013-12-23 (×2): 400 mg via ORAL
  Filled 2013-12-16 (×3): qty 1

## 2013-12-16 MED ORDER — PRENATAL MULTIVITAMIN CH: 1.0000 | ORAL_TABLET | Freq: Every day | ORAL | Status: DC

## 2013-12-16 MED ORDER — PROGESTERONE MICRONIZED 200 MG PO CAPS: 200.0000 mg | ORAL_CAPSULE | Freq: Every day | ORAL | Status: DC

## 2013-12-16 MED ORDER — ZOLPIDEM TARTRATE 5 MG PO TABS: 5.0000 mg | ORAL_TABLET | Freq: Every evening | ORAL | Status: DC | PRN

## 2013-12-16 MED ORDER — PROGESTERONE MICRONIZED 200 MG PO CAPS
200.0000 mg | ORAL_CAPSULE | Freq: Every day | ORAL | Status: DC
  Administered 2013-12-16 – 2013-12-22 (×7): 200 mg via VAGINAL
  Filled 2013-12-16 (×7): qty 1

## 2013-12-16 NOTE — MAU Note (Signed)
Patient was seen in the office this am and had protein in the urine, elevated blood pressure and contractions. Was 3 cm in the office.

## 2013-12-16 NOTE — Consult Note (Signed)
MFM consult  33 yr old G6P0050 at 632w3d with dichorionic/diamniotic twin gestation now with preeclampsia and preterm cervical dilation referred for consult.  Patient reports has had some off and on headaches for several days, feels occasional contractions, also reports some flashing lights in her vision. No lof or vb. +FM.   O: BPs in normal to mild range  Labs: normal AST and ALT Creatinine 0.79 Uric acid 9.4 Platelets 132 Urine protein/creatinine ratio is 2.09  A: 33 yr old G6P0050 at 5932w3d with di/di twins and preeclampsia.  Recommendations: 1. Preeclampsia: - meets criteria by blood pressures and urine protein - discussed at this time BP values and labs are consistent with mild preeclampsia - however discussed concern over symptoms of headache and flashing lights- recommend try tylenol and if no relief can try fioricet; if headache still persists recommend delivery for preeclampsia with severe features - if remains mild recommend delivery at 37 weeks or sooner if clinically indicated - if meets any severe criteria recommend delivery no later than 34 weeks or sooner depending on clinical scenario - if meets any severe criteria would recommend start magnesium sulfate for seizure prophylaxis and continue through 24 hours postpartum - if remains mild do not need to use magnesium sulfate but it is an option (if has neurologic symptoms would recommend magnesium sulfate) - would recommend anti-hypertensive therapy only for persistent severe range blood pressures - recommend repeat labs tomorrow - if has severe features recommend inpatient management until delivery - if remains mild would have low threshold for continuing inpatient monitoring- if is sent outpatient recommend twice weekly BP checks and NSTs with weekly AFI and labs (should only be considered if patient is symptom free and meets no criteria for severe preeclampsia)  2. Will have fetal growth 3. Had a course of betamethasone  3 weeks ago; can consider repeating since it has been >2 weeks 4. Recommend close surveillance of symptoms and blood pressures 5. Recommend NICU consult   I spent a total of 30 minutes with the patient of which >50% was in face to face consultation.  Discussed with Dr. Su Hiltoberts.  Ashley FosterKristen Tanish Prien, MD

## 2013-12-17 LAB — CBC
HCT: 37.1 % (ref 36.0–46.0)
Hemoglobin: 12.8 g/dL (ref 12.0–15.0)
MCH: 33 pg (ref 26.0–34.0)
MCHC: 34.5 g/dL (ref 30.0–36.0)
MCV: 95.6 fL (ref 78.0–100.0)
PLATELETS: 128 10*3/uL — AB (ref 150–400)
RBC: 3.88 MIL/uL (ref 3.87–5.11)
RDW: 13.7 % (ref 11.5–15.5)
WBC: 9.3 10*3/uL (ref 4.0–10.5)

## 2013-12-17 LAB — COMPREHENSIVE METABOLIC PANEL
ALT: 12 U/L (ref 0–35)
AST: 25 U/L (ref 0–37)
Albumin: 2.2 g/dL — ABNORMAL LOW (ref 3.5–5.2)
Alkaline Phosphatase: 164 U/L — ABNORMAL HIGH (ref 39–117)
Anion gap: 13 (ref 5–15)
BUN: 15 mg/dL (ref 6–23)
CALCIUM: 9.5 mg/dL (ref 8.4–10.5)
CO2: 18 meq/L — AB (ref 19–32)
CREATININE: 0.78 mg/dL (ref 0.50–1.10)
Chloride: 106 mEq/L (ref 96–112)
Glucose, Bld: 85 mg/dL (ref 70–99)
Potassium: 4 mEq/L (ref 3.7–5.3)
Sodium: 137 mEq/L (ref 137–147)
TOTAL PROTEIN: 5.6 g/dL — AB (ref 6.0–8.3)
Total Bilirubin: <0.2 mg/dL — ABNORMAL LOW (ref 0.3–1.2)

## 2013-12-17 LAB — URIC ACID: URIC ACID, SERUM: 9.8 mg/dL — AB (ref 2.4–7.0)

## 2013-12-17 LAB — LACTATE DEHYDROGENASE: LDH: 177 U/L (ref 94–250)

## 2013-12-17 MED ORDER — BETAMETHASONE SOD PHOS & ACET 6 (3-3) MG/ML IJ SUSP
12.0000 mg | Freq: Every day | INTRAMUSCULAR | Status: AC
  Administered 2013-12-17 – 2013-12-18 (×2): 12 mg via INTRAMUSCULAR
  Filled 2013-12-17 (×2): qty 2

## 2013-12-17 NOTE — Progress Notes (Signed)
Ready to be placed back on the monitor.

## 2013-12-17 NOTE — Progress Notes (Signed)
Patient ID: Ashley Farley, female   DOB: 15-Dec-1980, 33 y.o.   MRN: [id number], female   DOB: 15-Dec-1980, 33 y.o.   MRN: [id number] Ashley PacerDao Anh Tremont is a 33 y.o. G6P0050 at 6661w4d admitted for Twins with Preeclampsia  Subjective: Pt reports occas HA relieved mostly with tylenol.  Saw white spots for about 10 secs last night, no occurrences today.  Denies ctxs, lof or vb.  Pt asking about [facility] who is very sick with lung cancer.  Objective: BP 134/84  Pulse 77  Temp(Src) 97.8 F (36.6 C) (Oral)  Resp 18  Ht 5' (1.524 m)  Wt 78.654 kg (173 lb 6.4 oz)  BMI 33.86 kg/m2  SpO2 97% 120s-140s/70-80s I/O last 3 completed shifts: In: -  Out: 1950 [Urine:1950] Total I/O In: -  Out: 350 [Urine:350]  Physical Exam:  Gen: alert Chest/Lungs: cta bilaterally  Heart/Pulse: RRR  Abdomen: soft, gravid, nontender Uterine fundus: soft, nontender Skin & Color: warm and dry  Neurological: AOx3, DTRs 2+ EXT: negative Homan's b/l, edema 3+  FHT:  FHR: 120s-130s bpm, variability: moderate,  accelerations:  Present,  decelerations:  Absent x 2 UC:   occas SVE:   Dilation: 3 Exam by:: Thornell MuleK. Williams, CNM  Labs: Lab Results  Component Value Date   WBC 9.3 12/17/2013   HGB 12.8 12/17/2013   HCT 37.1 12/17/2013   MCV 95.6 12/17/2013   PLT 128* 12/17/2013   GBS PCR neg  Assessment and Plan: has Twins--di/di; Short cervix--1.36 on US today; Kidney stones--hx; H/O infertility--IVF pregnancy; and Preeclampsia on her problem list.  Continue in patient observation Will order weekly BPPs on Mondays and EFW q 2wks (last one done yest 12/16/13) Continuous Toco Fetal monitoring continuously for now secondary to question of baby B not growing well.  I am trying to get the report from the office and will await Dr. Colon Flatteryeckers rec. Plan per MFM delivery with severe features Will plan Mg pp unless develops severe features Will give repeat course of BMZ (1st injection given this morning around 9am)   Sharece Fleischhacker Y 12/17/2013, 2:01 PM

## 2013-12-17 NOTE — Progress Notes (Signed)
Patient requested to walk around the room.

## 2013-12-17 NOTE — Consult Note (Signed)
Asked by Dr.Roberts to provide prenatal consultation for [facility] for [facility]  Mother is 89 y.o with multiple pregnancy losses - this pregnancy via IVF with di/di twins, she is now 8733 5/[redacted] wks EGA.  She was given BMZ 3 weeks ago.  PIH mild at this point and MFM recommends prolonging pregnancy until 37 wks unless she has onset of severe features.  Discussed usual expectations for preterm infant at 2733 weeks gestation, including possible needs for DR resuscitation, respiratory and temperature support.  Discussed plans for skin-to-skin care and feeding with breast milk (she plans to pump postnatally)  Projected possible length of stay in NICU until 36 - [redacted] wks EGA.  Patient was attentive, had appropriate questions, and was appreciative of my input.  Thank you for the consultation.  Total time 25 minutes Face-t-face time 15 minutes

## 2013-12-17 NOTE — H&P (Signed)
Ashley Farley is a 33 y.o. female, G6P0050 at 5633.4 weeks w/ di/di twins (IVF pregnancy), presenting to [facility] from office for prolonged monitoring, serial BPs, pre-e w/u and recheck of her cervix. In office pt reported intermittent headaches, visual disturbances, nausea, vaginal pressure, occ ctxs and increased LE swelling. She denied RUQ pain, LOF, bleeding or any other pain.    Patient Active Problem List   Diagnosis Date Noted  . Preeclampsia without severe features 12/16/2013  . Twins--di/di - IVF pregnancy 11/26/2013  . Short cervix--1.36 on US today (received BMZ on 6/18 & 6/19) 11/26/2013  . Kidney stones--hx 11/26/2013  . H/O infertility--IVF pregnancy(embryo transfer occurred on 05/14/13. Previous pregnancy conceived with single euploid embryo but diagnosed as missed AB at 7 wks. Karyotype was 4346, XX) 11/26/2013   Past Medical History  Chronic Interstitial cystitis - stopped meds with positive UPT  Kidney stones - special diet (Calcium stones) Carpal tunnel syndrome  Sciatica  Endometriosis of pelvis Recurrent pregnancy loss (x5) IVF pregnancy  Past Surgical History  Procedure Laterality Date  . Dilation and curettage of uterus  12/2010 & 01/2013  . Laparoscopy  01/22/2011    Procedure: LAPAROSCOPY DIAGNOSTIC;  Surgeon: Zelphia CairoGretchen Adkins;  Location: [facility];  Service: Gynecology;  Laterality: N/A;   Dental Surgery procedure 2006 & 2008 ([facility]) Family History   Problem  Relation  Age of Onset   .  Diabetes  Mother    .  Hypertension  Mother    .  Diabetes  Father    .  Hypertension  Father    .  Cancer  Father    .  Stroke  Maternal Grandmother    High Cholesterol                                         Mother & Father    Results for orders placed during the hospital encounter of 12/16/13 (from the past 24 hour(s))  CBC     Status: Abnormal   Collection Time    12/17/13  5:02 AM      Result Value Ref Range   WBC 9.3  4.0 - 10.5 K/uL   RBC 3.88  3.87 - 5.11 MIL/uL   Hemoglobin 12.8  12.0 - 15.0 g/dL   HCT 91.437.1  78.236.0 - 95.646.0 %   MCV 95.6  78.0 - 100.0 fL   MCH 33.0  26.0 - 34.0 pg   MCHC 34.5  30.0 - 36.0 g/dL   RDW 21.313.7  08.611.5 - 57.815.5 %   Platelets 128 (*) 150 - 400 K/uL  COMPREHENSIVE METABOLIC PANEL     Status: Abnormal   Collection Time    12/17/13  5:02 AM      Result Value Ref Range   Sodium 137  137 - 147 mEq/L   Potassium 4.0  3.7 - 5.3 mEq/L   Chloride 106  96 - 112 mEq/L   CO2 18 (*) 19 - 32 mEq/L   Glucose, Bld 85  70 - 99 mg/dL   BUN 15  6 - 23 mg/dL   Creatinine, Ser 4.690.78  0.50 - 1.10 mg/dL   Calcium 9.5  8.4 - 62.910.5 mg/dL   Total Protein 5.6 (*) 6.0 - 8.3 g/dL   Albumin 2.2 (*) 3.5 - 5.2 g/dL   AST 25  0 - 37 U/L   ALT 12  0 -  35 U/L   Alkaline Phosphatase 164 (*) 39 - 117 U/L   Total Bilirubin <0.2 (*) 0.3 - 1.2 mg/dL   GFR calc non Af Amer >90  >90 mL/min   GFR calc Af Amer >90  >90 mL/min   Anion gap 13  5 - 15  LACTATE DEHYDROGENASE     Status: None   Collection Time    12/17/13  5:02 AM      Result Value Ref Range   LDH 177  94 - 250 U/L  URIC ACID     Status: Abnormal   Collection Time    12/17/13  5:02 AM      Result Value Ref Range   Uric Acid, Serum 9.8 (*) 2.4 - 7.0 mg/dL     History of present pregnancy:  Patient entered care at 10.5 weeks.  EDC of 01/30/2014 was established by 8.4 wk u/s (had embryo transfer on 05/14/13). Anatomy scan: 71 3/7weeks, with posterior placenta previa Twin A, Placenta edge to cervix = 0.74 cm, 1 cm from internal os. Cervix closed. Normal fluid. Twin B normal anatomy. Additional [place] evaluations: 9 2/7 weeks: Di/Di, retroflexed uterus. Twin A inferior; amnion and YS seen; Twin B superior; amnion and YS seen. Cx closed, normal adnexa. 9 6/7 weeks: Twin A: CRL 29.4 mm, HR 156 bpm, positive fetal motion. Twin B: CRL 29.0 mm, HR 159 bpm, positive fetal motion.  10 4/7 weeks for first trimester bleeding - twin living gestations; no complicating features identified. 15 3/7 weeks: Twin A vtx  inferior, Twin B vertex superior. Placenta covering cervix; cannot r/o placenta previa. Twin A vertical pocket 3.7 cm, Twin B vertical pocket 4.4 cm. 19 2/7 weeks: MFM -- Normal fluid, posterior placentas intact, no subchorionic fluid collections. Images submitted do not show relationship of the inferior placenta and cervix adequately. No adnexal pathology identified. 22 3/7 weeks: Twin A vtx-inferior-maternal left, posterior placenta, normal fluid. Normal linear growth, EFW 53rd%tile. Twin B superior vtx oblique maternal right, posterior placenta, normal fluid. Normal linear growth, EFW 47th%tile. 26 3/7 weeks: Twin A inferior left vtx. Posterior placenta, normal fluid. Vertical pocket 5.8 cm. Twin B superior right transverse head maternal left, posterior placenta, normal fluid. Vertical pocket 5.1 cm. Low lying placenta resolved. 30 5/7 weeks: Twin A vtx-maternal left, posterior placenta, normal fluid. AP pocket 4.9 cm, normal growth. Twin B superior-transverse head maternal left, posterior placenta, normal fluid. AP pocket 5.7 cm, normal growth. Cervix shortened @ 1.36 cm, no funneling.  Significant prenatal events: ANA positive - no h/o VTE, taking daily baby ASA. ANA titer < 1:4. Short cervix on 6/18. Received BMZ on 6/18 and 6/19. Started on Prometrium vaginally. +fFN on 6/22. Has been home on bedrest.   Last evaluation: 12/16/13; cervix 3/70/+1 per Dr. Su Hilt; BP 130/84  OB History    Grav  Para  Term  Preterm  Abortions  TAB  SAB  Ect  Mult  Living    6 0  0 0   5   0      Social History: reports that she has never smoked. She does not have any smokeless tobacco history on file. She reports that she drinks alcohol. She reports that she does not use illicit drugs. Husband Orvilla Fus is involved. Patient has 4 yrs of college, a homemaker, Asian in ethnicity, and of the [place] faith.  Prenatal Transfer Tool   Maternal Diabetes: No  Genetic Screening: Negative Maternal Ultrasounds/Referrals:  Resolved low lying placenta  Fetal Ultrasounds  or other Referrals: Referred to Materal Fetal Medicine due to ? placenta previa.  Maternal Substance Abuse: No  Significant Maternal Medications: Meds include: Baby ASA until 36 wks, PNV, folic acid, progesterone capsule Significant Maternal Lab Results: None  ROS: Occasional headache, visual disturbances, generalized swelling, +FM x 2   Allergies:  Allergies   Allergen  Reactions   .  Amoxicillin  Nausea And Vomiting and Rash   .  Vicodin [Hydrocodone-Acetaminophen]  Nausea And Vomiting and Rash   Prescriptions prior to admission  Medication  Sig  Dispense  Refill    aspirin 81 MG tablet  Take 81 mg by mouth daily.    folic acid (FOLVITE) 1 MG tablet  Take 1 mg by mouth daily.    Prenatal Vit-Fe Fumarate-FA (PRENATAL MULTIVITAMIN) TABS tablet  Take 1 tablet by mouth daily at 12 noon.    progesterone (PROMETRIUM) 200 MG capsule  Place 200 mg vaginally daily  Blood pressure 139/85, pulse 73, temperature 98.1 F (36.7 C), temperature source Oral, resp. rate 18, height 5' (1.524 m), weight 173 lb 6.4 oz (78.654 kg), SpO2 97.00%.  Physical Exam: Gen: Tearful - dad hospitalized at [facility]; has lung cancer. She is worried about him and the status of her pregnancy Lungs: CTAB  CV: RRR Abd gravid, NT, FH CWD  Pelvic: 3 cm per office exam Ext: 1+ DTRs bilaterally, 3+ pitting edema, no clonus FHR: Twin A 150-155, moderate variability, no decels; Twin B 150s, moderate variability, no decels  UCs: None Prenatal labs:  ABO, Rh: B+  Antibody: Neg  Rubella: Immune  VZV: Immune RPR: NR  HBsAg: Neg  HIV: NR  GBS: neg on 6/21 Sickle cell/Hgb electrophoresis: Neg  GC: Negative 07/14/13 and 11/26/13  Chlamydia: Negative 07/14/13 and 11/26/13  Genetic screenings: Negative for Fragile X, CF, SMA and APA Glucola: 1hr elevated at 174; 3hr GTT WNL  Other: +fFN on 11/30/13  Labs today significant for elevated uric acid, low plts and elevated  PCR. BPs 140-150/80-90.   Assessment/Plan:  Twin IUP at 33.4 weeks, di/di  Pre-e w/o severe features  Plan:  Admitted to Antenatal Unit per consult with Dr. Roberts [place] and MFM consult  NICU consult  GBS PCR CEFM 24 hr urine protein collection Repeat labs in a.m. Patient and spouse seem to understand the potential for prolonged hospitalization, risk of fetal compromise/loss, risk of bleeding, infection, other pregnancy complications.  Sherre Scarlet, CNM, MS 12/16/13 @ 8:00 PM

## 2013-12-18 LAB — COMPREHENSIVE METABOLIC PANEL
ALBUMIN: 2.2 g/dL — AB (ref 3.5–5.2)
ALT: 13 U/L (ref 0–35)
AST: 25 U/L (ref 0–37)
Alkaline Phosphatase: 171 U/L — ABNORMAL HIGH (ref 39–117)
Anion gap: 16 — ABNORMAL HIGH (ref 5–15)
BUN: 17 mg/dL (ref 6–23)
CHLORIDE: 103 meq/L (ref 96–112)
CO2: 17 mEq/L — ABNORMAL LOW (ref 19–32)
Calcium: 9.8 mg/dL (ref 8.4–10.5)
Creatinine, Ser: 0.79 mg/dL (ref 0.50–1.10)
GFR calc Af Amer: >90 mL/min (ref >90)
GFR calc non Af Amer: >90 mL/min (ref >90)
Glucose, Bld: 121 mg/dL — ABNORMAL HIGH (ref 70–99)
Potassium: 4.1 mEq/L (ref 3.7–5.3)
SODIUM: 136 meq/L — AB (ref 137–147)
TOTAL PROTEIN: 5.3 g/dL — AB (ref 6.0–8.3)
Total Bilirubin: <0.2 mg/dL — ABNORMAL LOW (ref 0.3–1.2)

## 2013-12-18 LAB — CBC
HCT: 35.9 % — ABNORMAL LOW (ref 36.0–46.0)
HEMOGLOBIN: 12.6 g/dL (ref 12.0–15.0)
MCH: 33.6 pg (ref 26.0–34.0)
MCHC: 35.1 g/dL (ref 30.0–36.0)
MCV: 95.7 fL (ref 78.0–100.0)
Platelets: 137 10*3/uL — ABNORMAL LOW (ref 150–400)
RBC: 3.75 MIL/uL — ABNORMAL LOW (ref 3.87–5.11)
RDW: 13.6 % (ref 11.5–15.5)
WBC: 11.8 10*3/uL — AB (ref 4.0–10.5)

## 2013-12-18 LAB — LACTATE DEHYDROGENASE: LDH: 211 U/L (ref 94–250)

## 2013-12-18 LAB — PROTEIN, URINE, 24 HOUR
COLLECTION INTERVAL-UPROT: 24 h
PROTEIN 24H UR: 5030 mg/d — AB (ref 50–100)
PROTEIN, URINE: 207 mg/dL
URINE TOTAL VOLUME-UPROT: 2430 mL

## 2013-12-18 LAB — URIC ACID: Uric Acid, Serum: 9.7 mg/dL — ABNORMAL HIGH (ref 2.4–7.0)

## 2013-12-18 MED ORDER — BUTALBITAL-APAP-CAFFEINE 50-325-40 MG PO TABS
1.0000 | ORAL_TABLET | ORAL | Status: DC | PRN
  Administered 2013-12-18 – 2013-12-20 (×4): 1 via ORAL
  Administered 2013-12-21 (×3): 2 via ORAL
  Administered 2013-12-22 (×2): 1 via ORAL
  Administered 2013-12-22: 2 via ORAL
  Filled 2013-12-18 (×2): qty 2
  Filled 2013-12-18 (×2): qty 1
  Filled 2013-12-18: qty 2
  Filled 2013-12-18: qty 1
  Filled 2013-12-18: qty 2
  Filled 2013-12-18: qty 1
  Filled 2013-12-18: qty 2
  Filled 2013-12-18: qty 1

## 2013-12-18 NOTE — Progress Notes (Signed)
Patient ID: Kara PacerDao Anh Verne, female   DOB: 1980/11/29, 33 y.o.   MRN: [id number]  Hospital day # 2 pregnancy at 792w5d - admitted for pre-eclampsia without severe features   S: well, reports good fetal activity x2  C/o HA this am, tylenol did help but now it's returning      Contractions:none      Vaginal bleeding:none now       Vaginal discharge: no significant change Also report some visual disturbances a couple times/day  Resolved about needing to stay hospitalized until delivery, in good spirits Hoping for vaginal delivery  Reports LEE about the same  Denies any N/V/RUQ pain    O: BP 133/82  Pulse 81  Temp(Src) 97.9 F (36.6 C) (Oral)  Resp 18  Ht 5' (1.524 m)  Wt 173 lb 6.4 oz (78.654 kg)  BMI 33.86 kg/m2  SpO2 97%  Filed Vitals:   12/18/13 0625 12/18/13 0833 12/18/13 1015 12/18/13 1219  BP: 128/68 146/91 133/82 144/83  Pulse: 79 69 81 82  Temp:  97.9 F (36.6 C)  97.7 F (36.5 C)  TempSrc:  Oral  Oral  Resp: 22 18 18 18   Height:      Weight:      SpO2:       BP range in last 24hrs 128-145/64-90       Fetal tracings:reviewed and reassuring x2       Uterus non-tender      Extremities: edema 2+ bilateral and Homans sign is negative, no sign of DVT  A: 7292w5d with pre-eclampsia, shortened cervix, di/di twins      US from yesterday reviewed w following findings  Baby A is vtx BPP 8/8, growth at 34% Baby B is transverse, BPP 8/8, growth at 28% Cervix 1cm length, no previa (had hx [facility])   GBS neg on 7/8  PIH Labs today stable, uric acid - 9.7, platelets 137      P:  will NST's TID Plan f/u growth US in 2wks (7/23) CTO BP's closely  rcv'd 2nd course of BMZ (2nd dose this am) rcv'd first course 6/18-6/19  Continue daily PIH Labs Consider delivery if sx's worsen, (BP's severe range, neurologic sx's or non-reassuring fetal status's) Dr Estanislado Pandyivard to bs and also discussing POC w pt Will order fiorcet for HA's, if no improvement, may consider midrin, as well      Arjay Jaskiewicz M  CNM  12/18/2013 12:19 PM

## 2013-12-19 LAB — COMPREHENSIVE METABOLIC PANEL
ALT: 15 U/L (ref 0–35)
AST: 29 U/L (ref 0–37)
Albumin: 2.4 g/dL — ABNORMAL LOW (ref 3.5–5.2)
Alkaline Phosphatase: 185 U/L — ABNORMAL HIGH (ref 39–117)
Anion gap: 16 — ABNORMAL HIGH (ref 5–15)
BUN: 18 mg/dL (ref 6–23)
CO2: 17 meq/L — AB (ref 19–32)
CREATININE: 0.78 mg/dL (ref 0.50–1.10)
Calcium: 9.5 mg/dL (ref 8.4–10.5)
Chloride: 105 mEq/L (ref 96–112)
GFR calc Af Amer: >90 mL/min (ref >90)
GLUCOSE: 109 mg/dL — AB (ref 70–99)
Potassium: 4.4 mEq/L (ref 3.7–5.3)
Sodium: 138 mEq/L (ref 137–147)
Total Protein: 6 g/dL (ref 6.0–8.3)

## 2013-12-19 LAB — CBC
HCT: 37.7 % (ref 36.0–46.0)
HEMOGLOBIN: 13.3 g/dL (ref 12.0–15.0)
MCH: 34 pg (ref 26.0–34.0)
MCHC: 35.3 g/dL (ref 30.0–36.0)
MCV: 96.4 fL (ref 78.0–100.0)
Platelets: 158 10*3/uL (ref 150–400)
RBC: 3.91 MIL/uL (ref 3.87–5.11)
RDW: 13.9 % (ref 11.5–15.5)
WBC: 14.7 10*3/uL — AB (ref 4.0–10.5)

## 2013-12-19 LAB — URIC ACID: URIC ACID, SERUM: 9.4 mg/dL — AB (ref 2.4–7.0)

## 2013-12-19 LAB — TYPE AND SCREEN
ABO/RH(D): B POS
Antibody Screen: NEGATIVE

## 2013-12-19 LAB — LACTATE DEHYDROGENASE: LDH: 243 U/L (ref 94–250)

## 2013-12-19 LAB — ABO/RH: ABO/RH(D): B POS

## 2013-12-19 MED ORDER — FAMOTIDINE 20 MG PO TABS
20.0000 mg | ORAL_TABLET | Freq: Every day | ORAL | Status: DC
  Administered 2013-12-19 – 2013-12-23 (×4): 20 mg via ORAL
  Filled 2013-12-19 (×4): qty 1

## 2013-12-19 NOTE — Progress Notes (Addendum)
Hospital day # 3 pregnancy at [redacted]w[redacted]d-D/Di IVF twins.  S:        Perception of contractions: none      Vaginal bleeding: none now       Vaginal discharge:  no significant change  O: BP 150/90  Pulse 70  Temp(Src) 97.9 F (36.6 C) (Oral)  Resp 20  Ht 5' (1.524 m)  Wt 173 lb 6.4 oz (78.654 kg)  BMI 33.86 kg/m2  SpO2 99% Filed Vitals:   12/19/13 0359 12/19/13 0845 12/19/13 0853 12/19/13 0940  BP: 151/83 150/90    Pulse: 71 70 63 70  Temp: 98.5 F (36.9 C) 97.9 F (36.6 C)    TempSrc: Oral Oral    Resp: 18 20    Height:      Weight:      SpO2:   95% 99%        Fetal tracings: category  1      Contractions:   none      Uterus gravid and non-tender, non distended      Extremities: +2 edema in both LE       C/O heart burn, SP spice food last night.  Pepcid ordered      C/O URQ pain last night, resolved.  No tenderness with palpations          Labs:   Recent Results (from the past 2160 hour(s))  PROTEIN / CREATININE RATIO, URINE     Status: Abnormal   Collection Time    12/16/13  1:55 PM      Result Value Ref Range   Creatinine, Urine 51.68     Total Protein, Urine 107.9     Comment: NO NORMAL RANGE ESTABLISHED FOR THIS TEST   PROTEIN CREATININE RATIO 2.09 (*) 0.00 - 0.15  CBC     Status: Abnormal   Collection Time    12/16/13  2:05 PM      Result Value Ref Range   WBC 8.9  4.0 - 10.5 K/uL   RBC 4.00  3.87 - 5.11 MIL/uL   Hemoglobin 13.2  12.0 - 15.0 g/dL   HCT 38.2  36.0 - 46.0 %   MCV 95.5  78.0 - 100.0 fL   MCH 33.0  26.0 - 34.0 pg   MCHC 34.6  30.0 - 36.0 g/dL   RDW 13.6  11.5 - 15.5 %   Platelets 132 (*) 150 - 400 K/uL  COMPREHENSIVE METABOLIC PANEL     Status: Abnormal   Collection Time    12/16/13  2:05 PM      Result Value Ref Range   Sodium 136 (*) 137 - 147 mEq/L   Potassium 4.2  3.7 - 5.3 mEq/L   Chloride 105  96 - 112 mEq/L   CO2 19  19 - 32 mEq/L   Glucose, Bld 106 (*) 70 - 99 mg/dL   BUN 12  6 - 23 mg/dL   Creatinine, Ser 0.79  0.50 - 1.10  mg/dL   Calcium 9.5  8.4 - 10.5 mg/dL   Total Protein 5.7 (*) 6.0 - 8.3 g/dL   Albumin 2.2 (*) 3.5 - 5.2 g/dL   AST 26  0 - 37 U/L   ALT 14  0 - 35 U/L   Alkaline Phosphatase 173 (*) 39 - 117 U/L   Total Bilirubin <0.2 (*) 0.3 - 1.2 mg/dL   GFR calc non Af Amer >90  >90 mL/min   GFR calc Af Amer >90  >90 mL/min  Comment: (NOTE)     The eGFR has been calculated using the CKD EPI equation.     This calculation has not been validated in all clinical situations.     eGFR's persistently <90 mL/min signify possible Chronic Kidney     Disease.   Anion gap 12  5 - 15  LACTATE DEHYDROGENASE     Status: None   Collection Time    12/16/13  2:05 PM      Result Value Ref Range   LDH 193  94 - 250 U/L  URIC ACID     Status: Abnormal   Collection Time    12/16/13  2:05 PM      Result Value Ref Range   Uric Acid, Serum 9.4 (*) 2.4 - 7.0 mg/dL  GROUP B STREP BY PCR     Status: None   Collection Time    12/16/13  3:43 PM      Result Value Ref Range   Group B strep by PCR NEGATIVE  NEGATIVE  PROTEIN, URINE, 24 HOUR     Status: Abnormal   Collection Time    12/17/13  3:00 AM      Result Value Ref Range   Urine Total Volume-UPROT 2430     Collection Interval-UPROT 24     Protein, Urine 207     Protein, 24H Urine 5030 (*) 50 - 100 mg/day   Comment: Performed at [facility]  CBC     Status: Abnormal   Collection Time    12/17/13  5:02 AM      Result Value Ref Range   WBC 9.3  4.0 - 10.5 K/uL   RBC 3.88  3.87 - 5.11 MIL/uL   Hemoglobin 12.8  12.0 - 15.0 g/dL   HCT 37.1  36.0 - 46.0 %   MCV 95.6  78.0 - 100.0 fL   MCH 33.0  26.0 - 34.0 pg   MCHC 34.5  30.0 - 36.0 g/dL   RDW 13.7  11.5 - 15.5 %   Platelets 128 (*) 150 - 400 K/uL  COMPREHENSIVE METABOLIC PANEL     Status: Abnormal   Collection Time    12/17/13  5:02 AM      Result Value Ref Range   Sodium 137  137 - 147 mEq/L   Potassium 4.0  3.7 - 5.3 mEq/L   Chloride 106  96 - 112 mEq/L   CO2 18 (*) 19 - 32 mEq/L    Glucose, Bld 85  70 - 99 mg/dL   BUN 15  6 - 23 mg/dL   Creatinine, Ser 0.78  0.50 - 1.10 mg/dL   Calcium 9.5  8.4 - 10.5 mg/dL   Total Protein 5.6 (*) 6.0 - 8.3 g/dL   Albumin 2.2 (*) 3.5 - 5.2 g/dL   AST 25  0 - 37 U/L   ALT 12  0 - 35 U/L   Alkaline Phosphatase 164 (*) 39 - 117 U/L   Total Bilirubin <0.2 (*) 0.3 - 1.2 mg/dL   GFR calc non Af Amer >90  >90 mL/min   GFR calc Af Amer >90  >90 mL/min   Comment: (NOTE)     The eGFR has been calculated using the CKD EPI equation.     This calculation has not been validated in all clinical situations.     eGFR's persistently <90 mL/min signify possible Chronic Kidney     Disease.   Anion gap 13  5 - 15  LACTATE  DEHYDROGENASE     Status: None   Collection Time    12/17/13  5:02 AM      Result Value Ref Range   LDH 177  94 - 250 U/L  URIC ACID     Status: Abnormal   Collection Time    12/17/13  5:02 AM      Result Value Ref Range   Uric Acid, Serum 9.8 (*) 2.4 - 7.0 mg/dL  CBC     Status: Abnormal   Collection Time    12/18/13  5:29 AM      Result Value Ref Range   WBC 11.8 (*) 4.0 - 10.5 K/uL   RBC 3.75 (*) 3.87 - 5.11 MIL/uL   Hemoglobin 12.6  12.0 - 15.0 g/dL   HCT 35.9 (*) 36.0 - 46.0 %   MCV 95.7  78.0 - 100.0 fL   MCH 33.6  26.0 - 34.0 pg   MCHC 35.1  30.0 - 36.0 g/dL   RDW 13.6  11.5 - 15.5 %   Platelets 137 (*) 150 - 400 K/uL  COMPREHENSIVE METABOLIC PANEL     Status: Abnormal   Collection Time    12/18/13  5:29 AM      Result Value Ref Range   Sodium 136 (*) 137 - 147 mEq/L   Potassium 4.1  3.7 - 5.3 mEq/L   Chloride 103  96 - 112 mEq/L   CO2 17 (*) 19 - 32 mEq/L   Glucose, Bld 121 (*) 70 - 99 mg/dL   BUN 17  6 - 23 mg/dL   Creatinine, Ser 0.79  0.50 - 1.10 mg/dL   Calcium 9.8  8.4 - 10.5 mg/dL   Total Protein 5.3 (*) 6.0 - 8.3 g/dL   Albumin 2.2 (*) 3.5 - 5.2 g/dL   AST 25  0 - 37 U/L   ALT 13  0 - 35 U/L   Alkaline Phosphatase 171 (*) 39 - 117 U/L   Total Bilirubin <0.2 (*) 0.3 - 1.2 mg/dL   GFR calc  non Af Amer >90  >90 mL/min   GFR calc Af Amer >90  >90 mL/min   Comment: (NOTE)     The eGFR has been calculated using the CKD EPI equation.     This calculation has not been validated in all clinical situations.     eGFR's persistently <90 mL/min signify possible Chronic Kidney     Disease.   Anion gap 16 (*) 5 - 15  URIC ACID     Status: Abnormal   Collection Time    12/18/13  5:29 AM      Result Value Ref Range   Uric Acid, Serum 9.7 (*) 2.4 - 7.0 mg/dL  LACTATE DEHYDROGENASE     Status: None   Collection Time    12/18/13  5:29 AM      Result Value Ref Range   LDH 211  94 - 250 U/L  CBC     Status: Abnormal   Collection Time    12/19/13  5:25 AM      Result Value Ref Range   WBC 14.7 (*) 4.0 - 10.5 K/uL   RBC 3.91  3.87 - 5.11 MIL/uL   Hemoglobin 13.3  12.0 - 15.0 g/dL   HCT 37.7  36.0 - 46.0 %   MCV 96.4  78.0 - 100.0 fL   MCH 34.0  26.0 - 34.0 pg   MCHC 35.3  30.0 - 36.0 g/dL   RDW 13.9  11.5 - 15.5 %   Platelets 158  150 - 400 K/uL  COMPREHENSIVE METABOLIC PANEL     Status: Abnormal   Collection Time    12/19/13  5:25 AM      Result Value Ref Range   Sodium 138  137 - 147 mEq/L   Potassium 4.4  3.7 - 5.3 mEq/L   Chloride 105  96 - 112 mEq/L   CO2 17 (*) 19 - 32 mEq/L   Glucose, Bld 109 (*) 70 - 99 mg/dL   BUN 18  6 - 23 mg/dL   Creatinine, Ser 0.78  0.50 - 1.10 mg/dL   Calcium 9.5  8.4 - 10.5 mg/dL   Total Protein 6.0  6.0 - 8.3 g/dL   Albumin 2.4 (*) 3.5 - 5.2 g/dL   AST 29  0 - 37 U/L   ALT 15  0 - 35 U/L   Alkaline Phosphatase 185 (*) 39 - 117 U/L   Total Bilirubin <0.2 (*) 0.3 - 1.2 mg/dL   GFR calc non Af Amer >90  >90 mL/min   GFR calc Af Amer >90  >90 mL/min   Comment: (NOTE)     The eGFR has been calculated using the CKD EPI equation.     This calculation has not been validated in all clinical situations.     eGFR's persistently <90 mL/min signify possible Chronic Kidney     Disease.   Anion gap 16 (*) 5 - 15  URIC ACID     Status: Abnormal    Collection Time    12/19/13  5:25 AM      Result Value Ref Range   Uric Acid, Serum 9.4 (*) 2.4 - 7.0 mg/dL  LACTATE DEHYDROGENASE     Status: None   Collection Time    12/19/13  5:25 AM      Result Value Ref Range   LDH 243  94 - 250 U/L  TYPE AND SCREEN     Status: None   Collection Time    12/19/13  5:25 AM      Result Value Ref Range   ABO/RH(D) B POS     Antibody Screen NEG     Sample Expiration 12/22/2013      . aspirin  81 mg Oral Daily  . docusate sodium  100 mg Oral Daily  . famotidine  20 mg Oral Daily  . folic acid  0.5 mg Oral Daily  . prenatal multivitamin  1 tablet Oral Q2200  . progesterone  200 mg Vaginal Q2200  . sodium chloride  3 mL Intravenous Q12H   BMZ complete x 2.  6/18 & 6/19, 7/9 & 7/10  A: 34w6dwith IVF Di/Di twins    Short cervix    Pre eclampsia     stable  P: Continue current plan of care      Upcoming tests/treatments:  Daily PIH labs, TID NST,       Pepcid daily     Close observatio of BPs and patient status      MDs will follow  Standard, Venus CNM, MSN 12/19/2013 9:44 AM   Seen and agreed

## 2013-12-20 LAB — COMPREHENSIVE METABOLIC PANEL
ALK PHOS: 167 U/L — AB (ref 39–117)
ALT: 15 U/L (ref 0–35)
AST: 30 U/L (ref 0–37)
Albumin: 2.2 g/dL — ABNORMAL LOW (ref 3.5–5.2)
Anion gap: 15 (ref 5–15)
BUN: 20 mg/dL (ref 6–23)
CO2: 18 mEq/L — ABNORMAL LOW (ref 19–32)
Calcium: 8.9 mg/dL (ref 8.4–10.5)
Chloride: 105 mEq/L (ref 96–112)
Creatinine, Ser: 0.85 mg/dL (ref 0.50–1.10)
GFR calc non Af Amer: 90 mL/min — ABNORMAL LOW (ref >90)
GLUCOSE: 96 mg/dL (ref 70–99)
POTASSIUM: 4.4 meq/L (ref 3.7–5.3)
Sodium: 138 mEq/L (ref 137–147)
Total Bilirubin: <0.2 mg/dL — ABNORMAL LOW (ref 0.3–1.2)
Total Protein: 5.3 g/dL — ABNORMAL LOW (ref 6.0–8.3)

## 2013-12-20 LAB — LACTATE DEHYDROGENASE: LDH: 254 U/L — ABNORMAL HIGH (ref 94–250)

## 2013-12-20 LAB — CBC
HEMATOCRIT: 36.7 % (ref 36.0–46.0)
HEMOGLOBIN: 12.6 g/dL (ref 12.0–15.0)
MCH: 33.2 pg (ref 26.0–34.0)
MCHC: 34.3 g/dL (ref 30.0–36.0)
MCV: 96.6 fL (ref 78.0–100.0)
Platelets: 147 10*3/uL — ABNORMAL LOW (ref 150–400)
RBC: 3.8 MIL/uL — ABNORMAL LOW (ref 3.87–5.11)
RDW: 14 % (ref 11.5–15.5)
WBC: 13.4 10*3/uL — ABNORMAL HIGH (ref 4.0–10.5)

## 2013-12-20 LAB — URIC ACID: Uric Acid, Serum: 10 mg/dL — ABNORMAL HIGH (ref 2.4–7.0)

## 2013-12-20 NOTE — Progress Notes (Signed)
Hospital day # 4 pregnancy at 7367w0d--Di/di twins, pre-eclampsia, short cervix.  S:  Doing well, mild HA today--feels may be related to hunger.      Perception of contractions: None      Vaginal bleeding: None       Vaginal discharge:  None  O: BP 126/76  Pulse 61  Temp(Src) 98.3 F (36.8 C) (Oral)  Resp 18  Ht 5' (1.524 m)  Wt 179 lb 4.8 oz (81.33 kg)  BMI 35.02 kg/m2  SpO2 95%  Filed Vitals:   12/19/13 1612 12/19/13 1937 12/19/13 2000 12/20/13 0558  BP: 140/89 145/93 141/79 126/76  Pulse: 66 64 64 61  Temp: 98.2 F (36.8 C) 98.2 F (36.8 C)  98.3 F (36.8 C)  TempSrc: Oral Oral  Oral  Resp: 20 20  18   Height:      Weight:      SpO2:             Fetal tracings:  Reassuring on TID monitoring      Contractions:   Occasional, mild on tracings      Uterus non-tender      Extremities: edema 2+ and Homans sign is negative, no sign of DVT          Labs:   Results for orders placed during the hospital encounter of 12/16/13 (from the past 24 hour(s))  CBC     Status: Abnormal   Collection Time    12/20/13  5:15 AM      Result Value Ref Range   WBC 13.4 (*) 4.0 - 10.5 K/uL   RBC 3.80 (*) 3.87 - 5.11 MIL/uL   Hemoglobin 12.6  12.0 - 15.0 g/dL   HCT 40.936.7  81.136.0 - 91.446.0 %   MCV 96.6  78.0 - 100.0 fL   MCH 33.2  26.0 - 34.0 pg   MCHC 34.3  30.0 - 36.0 g/dL   RDW 78.214.0  95.611.5 - 21.315.5 %   Platelets 147 (*) 150 - 400 K/uL  COMPREHENSIVE METABOLIC PANEL     Status: Abnormal   Collection Time    12/20/13  5:15 AM      Result Value Ref Range   Sodium 138  137 - 147 mEq/L   Potassium 4.4  3.7 - 5.3 mEq/L   Chloride 105  96 - 112 mEq/L   CO2 18 (*) 19 - 32 mEq/L   Glucose, Bld 96  70 - 99 mg/dL   BUN 20  6 - 23 mg/dL   Creatinine, Ser 0.860.85  0.50 - 1.10 mg/dL   Calcium 8.9  8.4 - 57.810.5 mg/dL   Total Protein 5.3 (*) 6.0 - 8.3 g/dL   Albumin 2.2 (*) 3.5 - 5.2 g/dL   AST 30  0 - 37 U/L   ALT 15  0 - 35 U/L   Alkaline Phosphatase 167 (*) 39 - 117 U/L   Total Bilirubin <0.2 (*) 0.3  - 1.2 mg/dL   GFR calc non Af Amer 90 (*) >90 mL/min   GFR calc Af Amer >90  >90 mL/min   Anion gap 15  5 - 15  URIC ACID     Status: Abnormal   Collection Time    12/20/13  5:15 AM      Result Value Ref Range   Uric Acid, Serum 10.0 (*) 2.4 - 7.0 mg/dL  LACTATE DEHYDROGENASE     Status: Abnormal   Collection Time    12/20/13  5:15 AM  Result Value Ref Range   LDH 254 (*) 94 - 250 U/L          Meds:  . aspirin  81 mg Oral Daily  . docusate sodium  100 mg Oral Daily  . famotidine  20 mg Oral Daily  . folic acid  0.5 mg Oral Daily  . prenatal multivitamin  1 tablet Oral Q2200  . progesterone  200 mg Vaginal Q2200  . sodium chloride  3 mL Intravenous Q12H    A: [redacted]w[redacted]d with di/di twins, pre-eclampsia, short cervix     Stable  P: Continue current plan of care      Upcoming tests/treatments:  Daily PIH labs, NST TID      MDs will follow  Nigel Bridgeman CNM, [place] 12/20/2013 7:54 AM

## 2013-12-21 ENCOUNTER — Inpatient Hospital Stay (HOSPITAL_COMMUNITY): Payer: BC Managed Care – PPO

## 2013-12-21 LAB — CBC
HCT: 37.5 % (ref 36.0–46.0)
Hemoglobin: 12.8 g/dL (ref 12.0–15.0)
MCH: 32.8 pg (ref 26.0–34.0)
MCHC: 34.1 g/dL (ref 30.0–36.0)
MCV: 96.2 fL (ref 78.0–100.0)
Platelets: 158 10*3/uL (ref 150–400)
RBC: 3.9 MIL/uL (ref 3.87–5.11)
RDW: 13.9 % (ref 11.5–15.5)
WBC: 15.3 10*3/uL — ABNORMAL HIGH (ref 4.0–10.5)

## 2013-12-21 LAB — COMPREHENSIVE METABOLIC PANEL
ALBUMIN: 2.1 g/dL — AB (ref 3.5–5.2)
ALT: 17 U/L (ref 0–35)
AST: 36 U/L (ref 0–37)
Alkaline Phosphatase: 164 U/L — ABNORMAL HIGH (ref 39–117)
Anion gap: 13 (ref 5–15)
BUN: 18 mg/dL (ref 6–23)
CO2: 18 mEq/L — ABNORMAL LOW (ref 19–32)
Calcium: 8.7 mg/dL (ref 8.4–10.5)
Chloride: 108 mEq/L (ref 96–112)
Creatinine, Ser: 0.82 mg/dL (ref 0.50–1.10)
GFR calc Af Amer: >90 mL/min (ref >90)
GFR calc non Af Amer: >90 mL/min (ref >90)
Glucose, Bld: 95 mg/dL (ref 70–99)
Potassium: 4.5 mEq/L (ref 3.7–5.3)
Sodium: 139 mEq/L (ref 137–147)
Total Bilirubin: <0.2 mg/dL — ABNORMAL LOW (ref 0.3–1.2)
Total Protein: 5.3 g/dL — ABNORMAL LOW (ref 6.0–8.3)

## 2013-12-21 LAB — URIC ACID: Uric Acid, Serum: 10.4 mg/dL — ABNORMAL HIGH (ref 2.4–7.0)

## 2013-12-21 LAB — LACTATE DEHYDROGENASE: LDH: 324 U/L — AB (ref 94–250)

## 2013-12-21 NOTE — Progress Notes (Signed)
Patient ID: Ashley Farley, female   DOB: May 31, 1981, 33 y.o.   MRN: [id number] Pt with complaint of headache.  No leakage of fluid or VB.  Good FM  BP 135/81  Pulse 63  Temp(Src) 97.7 F (36.5 C) (Oral)  Resp 18  Ht 5' (1.524 m)  Wt 81.738 kg (180 lb 3.2 oz)  BMI 35.19 kg/m2  SpO2 99%  FHTS category 1 times two  Toco none  Pt in NAD CV RRR Lungs CTAB abd  Gravid soft and NT GU no vb EXt no calf tenderness.  3 plus edema Results for orders placed during the hospital encounter of 12/16/13 (from the past 72 hour(s))  CBC     Status: Abnormal   Collection Time    12/19/13  5:25 AM      Result Value Ref Range   WBC 14.7 (*) 4.0 - 10.5 K/uL   RBC 3.91  3.87 - 5.11 MIL/uL   Hemoglobin 13.3  12.0 - 15.0 g/dL   HCT 37.7  36.0 - 46.0 %   MCV 96.4  78.0 - 100.0 fL   MCH 34.0  26.0 - 34.0 pg   MCHC 35.3  30.0 - 36.0 g/dL   RDW 13.9  11.5 - 15.5 %   Platelets 158  150 - 400 K/uL  COMPREHENSIVE METABOLIC PANEL     Status: Abnormal   Collection Time    12/19/13  5:25 AM      Result Value Ref Range   Sodium 138  137 - 147 mEq/L   Potassium 4.4  3.7 - 5.3 mEq/L   Chloride 105  96 - 112 mEq/L   CO2 17 (*) 19 - 32 mEq/L   Glucose, Bld 109 (*) 70 - 99 mg/dL   BUN 18  6 - 23 mg/dL   Creatinine, Ser 0.78  0.50 - 1.10 mg/dL   Calcium 9.5  8.4 - 10.5 mg/dL   Total Protein 6.0  6.0 - 8.3 g/dL   Albumin 2.4 (*) 3.5 - 5.2 g/dL   AST 29  0 - 37 U/L   ALT 15  0 - 35 U/L   Alkaline Phosphatase 185 (*) 39 - 117 U/L   Total Bilirubin <0.2 (*) 0.3 - 1.2 mg/dL   GFR calc non Af Amer >90  >90 mL/min   GFR calc Af Amer >90  >90 mL/min   Comment: (NOTE)     The eGFR has been calculated using the CKD EPI equation.     This calculation has not been validated in all clinical situations.     eGFR's persistently <90 mL/min signify possible Chronic Kidney     Disease.   Anion gap 16 (*) 5 - 15  URIC ACID     Status: Abnormal   Collection Time    12/19/13  5:25 AM      Result Value Ref Range   Uric Acid, Serum 9.4 (*) 2.4 - 7.0 mg/dL  LACTATE DEHYDROGENASE     Status: None   Collection Time    12/19/13  5:25 AM      Result Value Ref Range   LDH 243  94 - 250 U/L  TYPE AND SCREEN     Status: None   Collection Time    12/19/13  5:25 AM      Result Value Ref Range   ABO/RH(D) B POS     Antibody Screen NEG     Sample Expiration 12/22/2013    ABO/RH  Status: None   Collection Time    12/19/13  5:25 AM      Result Value Ref Range   ABO/RH(D) B POS    CBC     Status: Abnormal   Collection Time    12/20/13  5:15 AM      Result Value Ref Range   WBC 13.4 (*) 4.0 - 10.5 K/uL   RBC 3.80 (*) 3.87 - 5.11 MIL/uL   Hemoglobin 12.6  12.0 - 15.0 g/dL   HCT 36.7  36.0 - 46.0 %   MCV 96.6  78.0 - 100.0 fL   MCH 33.2  26.0 - 34.0 pg   MCHC 34.3  30.0 - 36.0 g/dL   RDW 14.0  11.5 - 15.5 %   Platelets 147 (*) 150 - 400 K/uL  COMPREHENSIVE METABOLIC PANEL     Status: Abnormal   Collection Time    12/20/13  5:15 AM      Result Value Ref Range   Sodium 138  137 - 147 mEq/L   Potassium 4.4  3.7 - 5.3 mEq/L   Chloride 105  96 - 112 mEq/L   CO2 18 (*) 19 - 32 mEq/L   Glucose, Bld 96  70 - 99 mg/dL   BUN 20  6 - 23 mg/dL   Creatinine, Ser 0.85  0.50 - 1.10 mg/dL   Calcium 8.9  8.4 - 10.5 mg/dL   Total Protein 5.3 (*) 6.0 - 8.3 g/dL   Albumin 2.2 (*) 3.5 - 5.2 g/dL   AST 30  0 - 37 U/L   ALT 15  0 - 35 U/L   Alkaline Phosphatase 167 (*) 39 - 117 U/L   Total Bilirubin <0.2 (*) 0.3 - 1.2 mg/dL   GFR calc non Af Amer 90 (*) >90 mL/min   GFR calc Af Amer >90  >90 mL/min   Comment: (NOTE)     The eGFR has been calculated using the CKD EPI equation.     This calculation has not been validated in all clinical situations.     eGFR's persistently <90 mL/min signify possible Chronic Kidney     Disease.   Anion gap 15  5 - 15  URIC ACID     Status: Abnormal   Collection Time    12/20/13  5:15 AM      Result Value Ref Range   Uric Acid, Serum 10.0 (*) 2.4 - 7.0 mg/dL  LACTATE  DEHYDROGENASE     Status: Abnormal   Collection Time    12/20/13  5:15 AM      Result Value Ref Range   LDH 254 (*) 94 - 250 U/L  CBC     Status: Abnormal   Collection Time    12/21/13  5:35 AM      Result Value Ref Range   WBC 15.3 (*) 4.0 - 10.5 K/uL   RBC 3.90  3.87 - 5.11 MIL/uL   Hemoglobin 12.8  12.0 - 15.0 g/dL   HCT 37.5  36.0 - 46.0 %   MCV 96.2  78.0 - 100.0 fL   MCH 32.8  26.0 - 34.0 pg   MCHC 34.1  30.0 - 36.0 g/dL   RDW 13.9  11.5 - 15.5 %   Platelets 158  150 - 400 K/uL  COMPREHENSIVE METABOLIC PANEL     Status: Abnormal   Collection Time    12/21/13  5:35 AM      Result Value Ref Range   Sodium 139  137 - 147 mEq/L   Potassium 4.5  3.7 - 5.3 mEq/L   Chloride 108  96 - 112 mEq/L   CO2 18 (*) 19 - 32 mEq/L   Glucose, Bld 95  70 - 99 mg/dL   BUN 18  6 - 23 mg/dL   Creatinine, Ser 0.82  0.50 - 1.10 mg/dL   Calcium 8.7  8.4 - 10.5 mg/dL   Total Protein 5.3 (*) 6.0 - 8.3 g/dL   Albumin 2.1 (*) 3.5 - 5.2 g/dL   AST 36  0 - 37 U/L   ALT 17  0 - 35 U/L   Alkaline Phosphatase 164 (*) 39 - 117 U/L   Total Bilirubin <0.2 (*) 0.3 - 1.2 mg/dL   GFR calc non Af Amer >90  >90 mL/min   GFR calc Af Amer >90  >90 mL/min   Comment: (NOTE)     The eGFR has been calculated using the CKD EPI equation.     This calculation has not been validated in all clinical situations.     eGFR's persistently <90 mL/min signify possible Chronic Kidney     Disease.   Anion gap 13  5 - 15  URIC ACID     Status: Abnormal   Collection Time    12/21/13  5:35 AM      Result Value Ref Range   Uric Acid, Serum 10.4 (*) 2.4 - 7.0 mg/dL  LACTATE DEHYDROGENASE     Status: Abnormal   Collection Time    12/21/13  5:35 AM      Result Value Ref Range   LDH 324 (*) 94 - 250 U/L    Assessment and Plan [redacted]w[redacted]d Di/Di twins with mild preeclampsia Pt with a headache.  P given fioricet will check back to see if it has gone away Continue current care

## 2013-12-22 ENCOUNTER — Inpatient Hospital Stay (HOSPITAL_COMMUNITY): Payer: BC Managed Care – PPO

## 2013-12-22 LAB — URIC ACID: URIC ACID, SERUM: 10.8 mg/dL — AB (ref 2.4–7.0)

## 2013-12-22 LAB — COMPREHENSIVE METABOLIC PANEL
ALBUMIN: 2.1 g/dL — AB (ref 3.5–5.2)
ALT: 20 U/L (ref 0–35)
ANION GAP: 14 (ref 5–15)
AST: 42 U/L — ABNORMAL HIGH (ref 0–37)
Alkaline Phosphatase: 174 U/L — ABNORMAL HIGH (ref 39–117)
BUN: 18 mg/dL (ref 6–23)
CALCIUM: 8.9 mg/dL (ref 8.4–10.5)
CHLORIDE: 105 meq/L (ref 96–112)
CO2: 19 mEq/L (ref 19–32)
CREATININE: 0.86 mg/dL (ref 0.50–1.10)
GFR calc Af Amer: >90 mL/min (ref >90)
GFR calc non Af Amer: 88 mL/min — ABNORMAL LOW (ref >90)
Glucose, Bld: 90 mg/dL (ref 70–99)
Potassium: 4.1 mEq/L (ref 3.7–5.3)
Sodium: 138 mEq/L (ref 137–147)
Total Protein: 5.5 g/dL — ABNORMAL LOW (ref 6.0–8.3)

## 2013-12-22 LAB — CBC
HEMATOCRIT: 37.6 % (ref 36.0–46.0)
HEMOGLOBIN: 12.9 g/dL (ref 12.0–15.0)
MCH: 32.9 pg (ref 26.0–34.0)
MCHC: 34.3 g/dL (ref 30.0–36.0)
MCV: 95.9 fL (ref 78.0–100.0)
PLATELETS: 139 10*3/uL — AB (ref 150–400)
RBC: 3.92 MIL/uL (ref 3.87–5.11)
RDW: 14 % (ref 11.5–15.5)
WBC: 13.8 10*3/uL — ABNORMAL HIGH (ref 4.0–10.5)

## 2013-12-22 LAB — LACTATE DEHYDROGENASE: LDH: 327 U/L — AB (ref 94–250)

## 2013-12-22 MED ORDER — BUTALBITAL-APAP-CAFFEINE 50-325-40 MG PO TABS: 2.0000 | ORAL_TABLET | ORAL | Status: DC

## 2013-12-22 MED ORDER — BUTALBITAL-APAP-CAFFEINE 50-325-40 MG PO TABS
2.0000 | ORAL_TABLET | Freq: Three times a day (TID) | ORAL | Status: DC
  Administered 2013-12-23 (×2): 2 via ORAL
  Filled 2013-12-22: qty 2
  Filled 2013-12-22: qty 1

## 2013-12-22 NOTE — Progress Notes (Signed)
Hospital day # 6 pregnancy at [redacted]w[redacted]d--Di/Di twins/IVF pregnancy.  S:  Call received from RN that pt overall not feeling well and reporting ctxs.      Perception of contractions: irregular since 0300, some painful, some not as much. Non-painful ones rate anywhere from 3-7/10 while the more painful ones rate 9/10.       Vaginal bleeding: none       Vaginal discharge:  no significant change  H/A relieved w/ Fioricet. Denies visual changes, RUQ pain, N/V, CP, SOB or VB.  O: BP 134/74  Pulse 66  Temp(Src) 97.6 F (36.4 C) (Tympanic)  Resp 18  Ht 5' (1.524 m)  Wt 180 lb 3.2 oz (81.738 kg)  BMI 35.19 kg/m2  SpO2 99%      Fetal tracings: Cat 1 x 2      Contractions: Irregular throughout the night, uterine irritability         Uterus gravid, soft and non-tender      Extremities: edema 3+ pitting     BPs since midnight 134-151/74  Cervix 3-4/70/+1  Results for orders placed during the hospital encounter of 12/16/13 (from the past 24 hour(s))  CBC     Status: Abnormal   Collection Time    12/22/13  6:15 AM      Result Value Ref Range   WBC 13.8 (*) 4.0 - 10.5 K/uL   RBC 3.92  3.87 - 5.11 MIL/uL   Hemoglobin 12.9  12.0 - 15.0 g/dL   HCT 16.1  09.6 - 04.5 %   MCV 95.9  78.0 - 100.0 fL   MCH 32.9  26.0 - 34.0 pg   MCHC 34.3  30.0 - 36.0 g/dL   RDW 40.9  81.1 - 91.4 %   Platelets 139 (*) 150 - 400 K/uL  COMPREHENSIVE METABOLIC PANEL     Status: Abnormal   Collection Time    12/22/13  6:15 AM      Result Value Ref Range   Sodium 138  137 - 147 mEq/L   Potassium 4.1  3.7 - 5.3 mEq/L   Chloride 105  96 - 112 mEq/L   CO2 19  19 - 32 mEq/L   Glucose, Bld 90  70 - 99 mg/dL   BUN 18  6 - 23 mg/dL   Creatinine, Ser 7.82  0.50 - 1.10 mg/dL   Calcium 8.9  8.4 - 95.6 mg/dL   Total Protein 5.5 (*) 6.0 - 8.3 g/dL   Albumin 2.1 (*) 3.5 - 5.2 g/dL   AST 42 (*) 0 - 37 U/L   ALT 20  0 - 35 U/L   Alkaline Phosphatase 174 (*) 39 - 117 U/L   Total Bilirubin <0.2 (*) 0.3 - 1.2 mg/dL   GFR calc  non Af Amer 88 (*) >90 mL/min   GFR calc Af Amer >90  >90 mL/min   Anion gap 14  5 - 15  URIC ACID     Status: Abnormal   Collection Time    12/22/13  6:15 AM      Result Value Ref Range   Uric Acid, Serum 10.8 (*) 2.4 - 7.0 mg/dL  LACTATE DEHYDROGENASE     Status: Abnormal   Collection Time    12/22/13  6:15 AM      Result Value Ref Range   LDH 327 (*) 94 - 250 U/L  TYPE AND SCREEN     Status: None   Collection Time    12/22/13  6:15 AM  Result Value Ref Range   ABO/RH(D) B POS     Antibody Screen NEG     Sample Expiration 12/25/2013         A: 3717w2d with di/di twins (IVF pregnancy)     Mild Pre-e. Stable labs and BPs     Shortened cervix (1.0 cm on 12/17/13)     Limited u/s for presentation completed on 12/21/13: Baby A vtx, normal fluid, posterior placenta; Baby B variable     position - breech at beginning of exam, then transverse head maternal left at the end of exam, posterior placenta. No     previa.      Steroid complete.      Minimal cervical change.  P: Reviewed today's labs and pt's c/o with Dr. Su Hiltoberts.      Will continue current plan of care per c/w MFM (Dr. Vincenza HewsQuinn).      Close surveillance of symptoms and blood pressures.     Continuous monitoring for now due to c/o ctxs.     Upcoming tests/treatments: BPP today. Growth scan on 12/31/13 (2 wk interval).     Sherre ScarletWILLIAMS, Krystian Ferrentino CNM, MS 12/22/2013 8:22 AM

## 2013-12-22 NOTE — Progress Notes (Addendum)
BPP today = 8/8 x2. Per MFM, continue daily monitoring and f/u w/ BPPs if tracings concerning.   Sherre ScarletKimberly Wendall Isabell, CNM, MS 12/22/13 @ 1:51 PM       ADDENDUM:  Call received from RN at 5:45 PM. Pt with h/a 7/10 despite 2 Fioricet tabs at 4 p.m. Contacted Dr. Normand Sloopillard who stated she would go evaluate patient.   Sherre ScarletKimberly Kayler Rise, CNM, MS 12/22/13 @ 5:46 PM

## 2013-12-22 NOTE — Progress Notes (Addendum)
Patient given tea and 2 packs saltine crackers. Palpating abdomen, soft without ucs.

## 2013-12-23 ENCOUNTER — Encounter (HOSPITAL_COMMUNITY)
Admission: AD | Disposition: A | Discharge status: Home / Self Care | Payer: Self-pay | Source: Ambulatory Visit | Attending: Obstetrics and Gynecology

## 2013-12-23 ENCOUNTER — Encounter (HOSPITAL_COMMUNITY): Payer: Self-pay | Admitting: Anesthesiology

## 2013-12-23 ENCOUNTER — Encounter (HOSPITAL_COMMUNITY): Payer: BC Managed Care – PPO | Admitting: Anesthesiology

## 2013-12-23 ENCOUNTER — Inpatient Hospital Stay (HOSPITAL_COMMUNITY): Payer: BC Managed Care – PPO | Admitting: Anesthesiology

## 2013-12-23 DIAGNOSIS — O1414 Severe pre-eclampsia complicating childbirth: Secondary | ICD-10-CM | POA: Diagnosis present

## 2013-12-23 LAB — CBC
HCT: 38 % (ref 36.0–46.0)
HEMATOCRIT: 29.5 % — AB (ref 36.0–46.0)
HEMOGLOBIN: 10.1 g/dL — AB (ref 12.0–15.0)
Hemoglobin: 13.1 g/dL (ref 12.0–15.0)
MCH: 33.2 pg (ref 26.0–34.0)
MCH: 32.8 pg (ref 26.0–34.0)
MCHC: 34.5 g/dL (ref 30.0–36.0)
MCHC: 34.2 g/dL (ref 30.0–36.0)
MCV: 96.2 fL (ref 78.0–100.0)
MCV: 95.8 fL (ref 78.0–100.0)
PLATELETS: 133 10*3/uL — AB (ref 150–400)
Platelets: 126 10*3/uL — ABNORMAL LOW (ref 150–400)
RBC: 3.95 MIL/uL (ref 3.87–5.11)
RBC: 3.08 MIL/uL — AB (ref 3.87–5.11)
RDW: 14.2 % (ref 11.5–15.5)
RDW: 14.3 % (ref 11.5–15.5)
WBC: 13.9 10*3/uL — AB (ref 4.0–10.5)
WBC: 17.8 10*3/uL — ABNORMAL HIGH (ref 4.0–10.5)

## 2013-12-23 LAB — COMPREHENSIVE METABOLIC PANEL
ALBUMIN: 2 g/dL — AB (ref 3.5–5.2)
ALBUMIN: 1.8 g/dL — AB (ref 3.5–5.2)
ALK PHOS: 171 U/L — AB (ref 39–117)
ALK PHOS: 134 U/L — AB (ref 39–117)
ALT: 18 U/L (ref 0–35)
ALT: 25 U/L (ref 0–35)
AST: 37 U/L (ref 0–37)
AST: 44 U/L — ABNORMAL HIGH (ref 0–37)
Anion gap: 13 (ref 5–15)
Anion gap: 12 (ref 5–15)
BUN: 16 mg/dL (ref 6–23)
BUN: 15 mg/dL (ref 6–23)
CALCIUM: 8 mg/dL — AB (ref 8.4–10.5)
CHLORIDE: 105 meq/L (ref 96–112)
CO2: 20 mEq/L (ref 19–32)
CO2: 18 mEq/L — ABNORMAL LOW (ref 19–32)
Calcium: 8.8 mg/dL (ref 8.4–10.5)
Chloride: 108 mEq/L (ref 96–112)
Creatinine, Ser: 0.87 mg/dL (ref 0.50–1.10)
Creatinine, Ser: 0.86 mg/dL (ref 0.50–1.10)
GFR calc Af Amer: >90 mL/min (ref >90)
GFR calc Af Amer: >90 mL/min (ref >90)
GFR calc non Af Amer: 87 mL/min — ABNORMAL LOW (ref >90)
GFR calc non Af Amer: 88 mL/min — ABNORMAL LOW (ref >90)
GLUCOSE: 100 mg/dL — AB (ref 70–99)
Glucose, Bld: 98 mg/dL (ref 70–99)
POTASSIUM: 4.1 meq/L (ref 3.7–5.3)
POTASSIUM: 4.6 meq/L (ref 3.7–5.3)
SODIUM: 138 meq/L (ref 137–147)
SODIUM: 138 meq/L (ref 137–147)
TOTAL PROTEIN: 4.3 g/dL — AB (ref 6.0–8.3)
Total Bilirubin: <0.2 mg/dL — ABNORMAL LOW (ref 0.3–1.2)
Total Protein: 5.3 g/dL — ABNORMAL LOW (ref 6.0–8.3)

## 2013-12-23 LAB — URIC ACID: URIC ACID, SERUM: 10.5 mg/dL — AB (ref 2.4–7.0)

## 2013-12-23 LAB — LACTATE DEHYDROGENASE: LDH: 347 U/L — ABNORMAL HIGH (ref 94–250)

## 2013-12-23 SURGERY — Surgical Case
Anesthesia: Spinal | Site: Abdomen

## 2013-12-23 MED ORDER — MAGNESIUM SULFATE 40 G IN LACTATED RINGERS - SIMPLE
2.0000 g/h | INTRAVENOUS | Status: DC
  Administered 2013-12-23: 2 g/h via INTRAVENOUS
  Filled 2013-12-23: qty 500

## 2013-12-23 MED ORDER — FENTANYL CITRATE 0.05 MG/ML IJ SOLN
INTRAMUSCULAR | Status: DC | PRN
  Administered 2013-12-23: 25 ug via INTRATHECAL
  Administered 2013-12-23: 100 ug via INTRAVENOUS

## 2013-12-23 MED ORDER — PHENYLEPHRINE 8 MG IN D5W 100 ML (0.08MG/ML) PREMIX OPTIME
INJECTION | INTRAVENOUS | Status: AC
  Filled 2013-12-23: qty 100

## 2013-12-23 MED ORDER — ONDANSETRON HCL 4 MG/2ML IJ SOLN
INTRAMUSCULAR | Status: AC
  Filled 2013-12-23: qty 2

## 2013-12-23 MED ORDER — FENTANYL CITRATE 0.05 MG/ML IJ SOLN
25.0000 ug | Freq: Once | INTRAMUSCULAR | Status: AC
Start: 2013-12-23 — End: 2013-12-23
  Administered 2013-12-23: 14:00:00 via INTRAVENOUS

## 2013-12-23 MED ORDER — LABETALOL HCL 5 MG/ML IV SOLN
10.0000 mg | INTRAVENOUS | Status: DC | PRN
  Administered 2013-12-23: 10 mg via INTRAVENOUS
  Filled 2013-12-23: qty 4

## 2013-12-23 MED ORDER — METOCLOPRAMIDE HCL 5 MG/ML IJ SOLN: 10.0000 mg | Freq: Three times a day (TID) | INTRAMUSCULAR | Status: DC | PRN

## 2013-12-23 MED ORDER — BUTORPHANOL TARTRATE 1 MG/ML IJ SOLN
1.0000 mg | Freq: Once | INTRAMUSCULAR | Status: AC
  Administered 2013-12-23: 1 mg via INTRAVENOUS
  Filled 2013-12-23: qty 1

## 2013-12-23 MED ORDER — ONDANSETRON HCL 4 MG/2ML IJ SOLN: 4.0000 mg | INTRAMUSCULAR | Status: DC | PRN

## 2013-12-23 MED ORDER — LIDOCAINE HCL (PF) 1 % IJ SOLN
INTRAMUSCULAR | Status: AC
  Filled 2013-12-23: qty 5

## 2013-12-23 MED ORDER — ONDANSETRON HCL 4 MG PO TABS: 4.0000 mg | ORAL_TABLET | ORAL | Status: DC | PRN

## 2013-12-23 MED ORDER — BUPIVACAINE HCL (PF) 0.25 % IJ SOLN
INTRAMUSCULAR | Status: AC
  Filled 2013-12-23: qty 30

## 2013-12-23 MED ORDER — LANOLIN HYDROUS EX OINT: 1.0000 "application " | TOPICAL_OINTMENT | CUTANEOUS | Status: DC | PRN

## 2013-12-23 MED ORDER — LACTATED RINGERS IV SOLN
INTRAVENOUS | Status: DC
  Administered 2013-12-23 (×2): via INTRAVENOUS

## 2013-12-23 MED ORDER — SODIUM CHLORIDE 0.9 % IJ SOLN: 3.0000 mL | INTRAMUSCULAR | Status: DC | PRN

## 2013-12-23 MED ORDER — DIBUCAINE 1 % RE OINT: 1.0000 "application " | TOPICAL_OINTMENT | RECTAL | Status: DC | PRN

## 2013-12-23 MED ORDER — ONDANSETRON HCL 4 MG/2ML IJ SOLN: 4.0000 mg | Freq: Three times a day (TID) | INTRAMUSCULAR | Status: DC | PRN

## 2013-12-23 MED ORDER — MISOPROSTOL 100 MCG PO TABS
ORAL_TABLET | ORAL | Status: DC | PRN
  Administered 2013-12-23: 1000 ug via RECTAL

## 2013-12-23 MED ORDER — DIPHENHYDRAMINE HCL 25 MG PO CAPS
25.0000 mg | ORAL_CAPSULE | Freq: Four times a day (QID) | ORAL | Status: DC | PRN
  Filled 2013-12-23: qty 1

## 2013-12-23 MED ORDER — OXYTOCIN 40 UNITS IN LACTATED RINGERS INFUSION - SIMPLE MED
INTRAVENOUS | Status: DC | PRN
  Administered 2013-12-23: 40 [IU] via INTRAVENOUS

## 2013-12-23 MED ORDER — WITCH HAZEL-GLYCERIN EX PADS: 1.0000 "application " | MEDICATED_PAD | CUTANEOUS | Status: DC | PRN

## 2013-12-23 MED ORDER — PHENYLEPHRINE HCL 10 MG/ML IJ SOLN
INTRAMUSCULAR | Status: DC | PRN
  Administered 2013-12-23 (×6): 40 ug via INTRAVENOUS

## 2013-12-23 MED ORDER — TETANUS-DIPHTH-ACELL PERTUSSIS 5-2.5-18.5 LF-MCG/0.5 IM SUSP
0.5000 mL | Freq: Once | INTRAMUSCULAR | Status: AC
  Administered 2013-12-24: 0.5 mL via INTRAMUSCULAR
  Filled 2013-12-23: qty 0.5

## 2013-12-23 MED ORDER — METHYLERGONOVINE MALEATE 0.2 MG PO TABS: 0.2000 mg | ORAL_TABLET | ORAL | Status: DC | PRN

## 2013-12-23 MED ORDER — OXYTOCIN 10 UNIT/ML IJ SOLN
INTRAMUSCULAR | Status: AC
  Filled 2013-12-23: qty 4

## 2013-12-23 MED ORDER — LACTATED RINGERS IV SOLN
INTRAVENOUS | Status: DC
  Administered 2013-12-23 – 2013-12-24 (×2): via INTRAVENOUS

## 2013-12-23 MED ORDER — FENTANYL CITRATE 0.05 MG/ML IJ SOLN: 25.0000 ug | INTRAMUSCULAR | Status: DC | PRN

## 2013-12-23 MED ORDER — FENTANYL CITRATE 0.05 MG/ML IJ SOLN
INTRAMUSCULAR | Status: AC
  Filled 2013-12-23: qty 2

## 2013-12-23 MED ORDER — MENTHOL 3 MG MT LOZG: 1.0000 | LOZENGE | OROMUCOSAL | Status: DC | PRN

## 2013-12-23 MED ORDER — SCOPOLAMINE 1 MG/3DAYS TD PT72
1.0000 | MEDICATED_PATCH | Freq: Once | TRANSDERMAL | Status: DC
  Administered 2013-12-23: 1.5 mg via TRANSDERMAL

## 2013-12-23 MED ORDER — BUPIVACAINE HCL (PF) 0.25 % IJ SOLN
INTRAMUSCULAR | Status: DC | PRN
  Administered 2013-12-23: 20 mL

## 2013-12-23 MED ORDER — MISOPROSTOL 200 MCG PO TABS
ORAL_TABLET | ORAL | Status: AC
  Filled 2013-12-23: qty 5

## 2013-12-23 MED ORDER — OXYTOCIN 40 UNITS IN LACTATED RINGERS INFUSION - SIMPLE MED: 62.5000 mL/h | INTRAVENOUS | Status: AC

## 2013-12-23 MED ORDER — LACTATED RINGERS IV SOLN
INTRAVENOUS | Status: DC | PRN
  Administered 2013-12-23: 15:00:00 via INTRAVENOUS

## 2013-12-23 MED ORDER — FERROUS SULFATE 325 (65 FE) MG PO TABS
325.0000 mg | ORAL_TABLET | Freq: Two times a day (BID) | ORAL | Status: DC
  Administered 2013-12-24 – 2013-12-26 (×5): 325 mg via ORAL
  Filled 2013-12-23 (×5): qty 1

## 2013-12-23 MED ORDER — SIMETHICONE 80 MG PO CHEW
80.0000 mg | CHEWABLE_TABLET | Freq: Three times a day (TID) | ORAL | Status: DC
  Administered 2013-12-23 – 2013-12-25 (×7): 80 mg via ORAL
  Filled 2013-12-23 (×8): qty 1

## 2013-12-23 MED ORDER — DIPHENHYDRAMINE HCL 50 MG/ML IJ SOLN: 12.5000 mg | INTRAMUSCULAR | Status: DC | PRN

## 2013-12-23 MED ORDER — SCOPOLAMINE 1 MG/3DAYS TD PT72
MEDICATED_PATCH | TRANSDERMAL | Status: AC
Start: 2013-12-23 — End: 2013-12-24
  Filled 2013-12-23: qty 1

## 2013-12-23 MED ORDER — ZOLPIDEM TARTRATE 5 MG PO TABS: 5.0000 mg | ORAL_TABLET | Freq: Every evening | ORAL | Status: DC | PRN

## 2013-12-23 MED ORDER — PHENYLEPHRINE 8 MG IN D5W 100 ML (0.08MG/ML) PREMIX OPTIME
INJECTION | INTRAVENOUS | Status: DC | PRN
  Administered 2013-12-23: 50 ug/min via INTRAVENOUS

## 2013-12-23 MED ORDER — DIPHENHYDRAMINE HCL 25 MG PO CAPS
25.0000 mg | ORAL_CAPSULE | ORAL | Status: DC | PRN
  Filled 2013-12-23: qty 1

## 2013-12-23 MED ORDER — DIPHENHYDRAMINE HCL 50 MG/ML IJ SOLN: 25.0000 mg | INTRAMUSCULAR | Status: DC | PRN

## 2013-12-23 MED ORDER — LACTATED RINGERS IV SOLN
INTRAVENOUS | Status: DC | PRN
  Administered 2013-12-23 (×2): via INTRAVENOUS

## 2013-12-23 MED ORDER — PRENATAL MULTIVITAMIN CH
1.0000 | ORAL_TABLET | Freq: Every day | ORAL | Status: DC
  Administered 2013-12-24 – 2013-12-26 (×3): 1 via ORAL
  Filled 2013-12-23 (×3): qty 1

## 2013-12-23 MED ORDER — SENNOSIDES-DOCUSATE SODIUM 8.6-50 MG PO TABS
2.0000 | ORAL_TABLET | ORAL | Status: DC
  Administered 2013-12-23 – 2013-12-26 (×3): 2 via ORAL
  Filled 2013-12-23 (×3): qty 2

## 2013-12-23 MED ORDER — CITRIC ACID-SODIUM CITRATE 334-500 MG/5ML PO SOLN
ORAL | Status: AC
  Administered 2013-12-23: 30 mL
  Filled 2013-12-23: qty 15

## 2013-12-23 MED ORDER — PHENYLEPHRINE 40 MCG/ML (10ML) SYRINGE FOR IV PUSH (FOR BLOOD PRESSURE SUPPORT)
PREFILLED_SYRINGE | INTRAVENOUS | Status: AC
  Filled 2013-12-23: qty 5

## 2013-12-23 MED ORDER — MAGNESIUM SULFATE 4000MG/100ML IJ SOLN
4.0000 g | Freq: Once | INTRAMUSCULAR | Status: DC
  Filled 2013-12-23: qty 100

## 2013-12-23 MED ORDER — MEPERIDINE HCL 25 MG/ML IJ SOLN: 6.2500 mg | INTRAMUSCULAR | Status: DC | PRN

## 2013-12-23 MED ORDER — NALOXONE HCL 0.4 MG/ML IJ SOLN: 0.4000 mg | INTRAMUSCULAR | Status: DC | PRN

## 2013-12-23 MED ORDER — MAGNESIUM SULFATE 40 G IN LACTATED RINGERS - SIMPLE
2.0000 g/h | INTRAVENOUS | Status: DC
  Filled 2013-12-23: qty 500

## 2013-12-23 MED ORDER — MAGNESIUM SULFATE BOLUS VIA INFUSION
4.0000 g | Freq: Once | INTRAVENOUS | Status: AC
  Administered 2013-12-23: 4 g via INTRAVENOUS
  Filled 2013-12-23: qty 500

## 2013-12-23 MED ORDER — CLINDAMYCIN PHOSPHATE 600 MG/50ML IV SOLN
600.0000 mg | Freq: Once | INTRAVENOUS | Status: AC
  Administered 2013-12-23: 600 mg via INTRAVENOUS
  Filled 2013-12-23: qty 50

## 2013-12-23 MED ORDER — MORPHINE SULFATE 0.5 MG/ML IJ SOLN
INTRAMUSCULAR | Status: AC
  Filled 2013-12-23: qty 10

## 2013-12-23 MED ORDER — MEASLES, MUMPS & RUBELLA VAC ~~LOC~~ INJ: 0.5000 mL | INJECTION | Freq: Once | SUBCUTANEOUS | Status: DC

## 2013-12-23 MED ORDER — SIMETHICONE 80 MG PO CHEW
80.0000 mg | CHEWABLE_TABLET | ORAL | Status: DC | PRN
  Administered 2013-12-26: 80 mg via ORAL

## 2013-12-23 MED ORDER — BUPIVACAINE IN DEXTROSE 0.75-8.25 % IT SOLN
INTRATHECAL | Status: DC | PRN
  Administered 2013-12-23: 1.4 mL via INTRATHECAL

## 2013-12-23 MED ORDER — OXYCODONE-ACETAMINOPHEN 5-325 MG PO TABS
1.0000 | ORAL_TABLET | ORAL | Status: DC | PRN
  Administered 2013-12-24: 1 via ORAL
  Administered 2013-12-25: 2 via ORAL
  Administered 2013-12-25: 1 via ORAL
  Administered 2013-12-25 – 2013-12-26 (×5): 2 via ORAL
  Filled 2013-12-23 (×6): qty 2
  Filled 2013-12-23 (×2): qty 1

## 2013-12-23 MED ORDER — CLINDAMYCIN PHOSPHATE 300 MG/2ML IJ SOLN
600.0000 mg | Freq: Once | INTRAMUSCULAR | Status: DC
  Filled 2013-12-23: qty 4

## 2013-12-23 MED ORDER — IBUPROFEN 600 MG PO TABS
600.0000 mg | ORAL_TABLET | Freq: Four times a day (QID) | ORAL | Status: DC
  Administered 2013-12-23 – 2013-12-26 (×12): 600 mg via ORAL
  Filled 2013-12-23 (×12): qty 1

## 2013-12-23 MED ORDER — METHYLERGONOVINE MALEATE 0.2 MG/ML IJ SOLN: 0.2000 mg | INTRAMUSCULAR | Status: DC | PRN

## 2013-12-23 MED ORDER — SIMETHICONE 80 MG PO CHEW
80.0000 mg | CHEWABLE_TABLET | ORAL | Status: DC
  Administered 2013-12-24 – 2013-12-26 (×2): 80 mg via ORAL
  Filled 2013-12-23 (×3): qty 1

## 2013-12-23 MED ORDER — NALOXONE HCL 1 MG/ML IJ SOLN: 1.0000 ug/kg/h | INTRAMUSCULAR | Status: DC | PRN

## 2013-12-23 MED ORDER — MORPHINE SULFATE (PF) 0.5 MG/ML IJ SOLN
INTRAMUSCULAR | Status: DC | PRN
  Administered 2013-12-23: .15 mg via INTRATHECAL

## 2013-12-23 SURGICAL SUPPLY — 40 items
APL SKNCLS STERI-STRIP NONHPOA (GAUZE/BANDAGES/DRESSINGS) ×1
BENZOIN TINCTURE PRP APPL 2/3 (GAUZE/BANDAGES/DRESSINGS) ×3 IMPLANT
CLAMP CORD UMBIL (MISCELLANEOUS) IMPLANT
CLOSURE WOUND 1/2 X4 (GAUZE/BANDAGES/DRESSINGS) ×1
CLOTH BEACON ORANGE TIMEOUT ST (SAFETY) ×3 IMPLANT
CONTAINER PREFILL 10% NBF 15ML (MISCELLANEOUS) IMPLANT
DRAPE LG THREE QUARTER DISP (DRAPES) IMPLANT
DRSG OPSITE POSTOP 4X10 (GAUZE/BANDAGES/DRESSINGS) ×3 IMPLANT
DURAPREP 26ML APPLICATOR (WOUND CARE) ×3 IMPLANT
ELECT REM PT RETURN 9FT ADLT (ELECTROSURGICAL) ×3
ELECTRODE REM PT RTRN 9FT ADLT (ELECTROSURGICAL) ×1 IMPLANT
EXTRACTOR VACUUM M CUP 4 TUBE (SUCTIONS) IMPLANT
EXTRACTOR VACUUM M CUP 4' TUBE (SUCTIONS)
GLOVE BIO SURGEON STRL SZ7.5 (GLOVE) ×3 IMPLANT
GLOVE BIOGEL PI IND STRL 7.5 (GLOVE) ×1 IMPLANT
GLOVE BIOGEL PI INDICATOR 7.5 (GLOVE) ×2
GOWN STRL REUS W/TWL LRG LVL3 (GOWN DISPOSABLE) ×6 IMPLANT
KIT ABG SYR 3ML LUER SLIP (SYRINGE) IMPLANT
NDL HYPO 25X5/8 SAFETYGLIDE (NEEDLE) IMPLANT
NEEDLE HYPO 25X5/8 SAFETYGLIDE (NEEDLE) IMPLANT
NS IRRIG 1000ML POUR BTL (IV SOLUTION) ×3 IMPLANT
PACK C SECTION WH (CUSTOM PROCEDURE TRAY) ×3 IMPLANT
PAD ABD 7.5X8 STRL (GAUZE/BANDAGES/DRESSINGS) ×2 IMPLANT
PAD ABD 8X7 1/2 STERILE (GAUZE/BANDAGES/DRESSINGS) ×2 IMPLANT
PAD OB MATERNITY 4.3X12.25 (PERSONAL CARE ITEMS) ×3 IMPLANT
RETRACTOR WND ALEXIS 25 LRG (MISCELLANEOUS) ×1 IMPLANT
RTRCTR WOUND ALEXIS 25CM LRG (MISCELLANEOUS) ×3
STRIP CLOSURE SKIN 1/2X4 (GAUZE/BANDAGES/DRESSINGS) ×2 IMPLANT
SUT CHROMIC 2 0 CT 1 (SUTURE) ×3 IMPLANT
SUT MNCRL AB 3-0 PS2 27 (SUTURE) ×3 IMPLANT
SUT PLAIN 0 NONE (SUTURE) IMPLANT
SUT PLAIN 2 0 XLH (SUTURE) ×3 IMPLANT
SUT VIC AB 0 CT1 36 (SUTURE) ×3 IMPLANT
SUT VIC AB 0 CTX 36 (SUTURE) ×9
SUT VIC AB 0 CTX36XBRD ANBCTRL (SUTURE) ×3 IMPLANT
SUT VIC AB 1 CTX 36 (SUTURE) ×6
SUT VIC AB 1 CTX36XBRD ANBCTRL (SUTURE) IMPLANT
TOWEL OR 17X24 6PK STRL BLUE (TOWEL DISPOSABLE) ×3 IMPLANT
TRAY FOLEY CATH 14FR (SET/KITS/TRAYS/PACK) ×3 IMPLANT
WATER STERILE IRR 1000ML POUR (IV SOLUTION) ×3 IMPLANT

## 2013-12-23 NOTE — Progress Notes (Addendum)
C/w Dr. Estanislado Pandyivard - Evaluate HA symptoms, phenergan 12.5 mg, caffeine  Pt describes HA as the same pain she has experienced since admission - a throbbing at the top of her head - however over time it was become progressively worse with the medicine (Tylenol, Fioricet, Stadol) working for about an hour at the beginning now not working at all.   Discussed the recommendation of Phenergan, pt refused to take Phenergan stating that it has made her "throw up" when she has taken it before and she does not like the way it makes her feel.    Pt states she hydrates with water and juice only prior to and during the pregnancy.  Discussed taking some caffeine to see if this helps her HA, pt was willing to try.  Updated Dr. Estanislado Pandyivard on Phenergan refusal and the pt's willingness to try caffeine - more extensive report to Dr. Estanislado Pandyivard after assessment of HA after caffeine.  Recent BPs stable  Filed Vitals:   12/23/13 0823 12/23/13 0833 12/23/13 0853 12/23/13 0940  BP: 135/78 138/76 139/71   Pulse: 62 63 63   Temp:   97.8 F (36.6 C)   TempSrc:   Oral   Resp:   16   Height:      Weight:    180 lb 3.2 oz (81.738 kg)  SpO2:        JENNIFER OXLEY CNM   Patient still with severe headache 10/10 despite Fioricet x 2, Dilaudid and caffeine. Reports blurry vision without scotoma. Denies RUQ pain. C/O shortness of breath NST Category 1 x2 Lungs: clear Ext: 3+ pitting edema with DTR 2/4 and no clonus  Asessment: Di-di IVF twin pregnancy at 34+3 weeks s/p BMZ Pre-eclampsia now with severe features  Plan:  Recommend to proceed with delivery Will start Magnesium sulfate Mode of delivery reviewed: patient is an excellent candidate for IOL with vertex / transverse, AGA, favorable cervix.  Cesarean also reviewed: couple desires to proceed with cesarean delivery because of how miserable patient feels right now and unknown length of labor.   Cesarean section reviewed with pt with R&B including but not limited  to:  bleeding, infection, injury to other organs. Low transverse approach planned which will allow vaginal delivery with future pregnancies. Should a vertical incision or inverted T be needed, patient is aware that repeat cesarean sections would be recommended in the future. Expected hospital stay and recovery also discussed.  Reviewed with dr Sherrie Georgeecker who agrees with plan Reviewed with Dr Malen GauzeFoster

## 2013-12-23 NOTE — Progress Notes (Signed)
Patient ID: Ashley Farley, female   DOB: 07-12-1980, 33 y.o.   MRN: [id number]  Hospital day # 7 pregnancy at 7164w3d w di/di twins and pre-eclampsia   S: pt c/o severe headache, fioricet didn't help, rcv'd dose of stadol about 430am, just now have some nausea, states she just feels "miserable" denies any visual changes      Contractions:denies      Vaginal bleeding:none now       Vaginal discharge: no significant change  O: BP 175/115  Pulse 67  Temp(Src) 97.7 F (36.5 C) (Axillary)  Resp 20  Ht 5' (1.524 m)  Wt 179 lb 11.2 oz (81.511 kg)  BMI 35.10 kg/m2  SpO2 97%        Fetal tracings:RN just now placing monitors on       Uterus non-tender      Extremities: edema 3+ pitting,  and 2+ DTR's, no clonus  A: 3864w3d with di/di twins, pre-eclampsia,  Condition: gradually worsening rcv'd 1 dose IV labetalol and BP decreased to 140's90's  Continue w comfort measures, pt declines anymore medications at this point, offered phenergan/zofran or alternative IV narcotic   P: c/w Dr Normand Sloopillard,  Will order IV labetalol w parameters  Place EFM CTO closely   Sheralee Qazi M  CNM  12/23/2013 6:53 AM

## 2013-12-23 NOTE — Progress Notes (Signed)
Patient states she is feeling pain in her back that rounds around to the front of her abdomen.  I will place her on the monitor to assess for contractions.

## 2013-12-23 NOTE — Transfer of Care (Signed)
Immediate Anesthesia Transfer of Care Note  Patient: Ashley Farley  Procedure(s) Performed: Procedure(s): CESAREAN SECTION (N/A)  Patient Location: PACU  Anesthesia Type:Spinal  Level of Consciousness: awake, alert  and oriented  Airway & Oxygen Therapy: Patient Spontanous Breathing  Post-op Assessment: Report given to PACU RN and Post -op Vital signs reviewed and stable  Post vital signs: Reviewed and stable  Complications: No apparent anesthesia complications

## 2013-12-23 NOTE — Op Note (Signed)
Preoperative diagnosis: Intrauterine twin pregnancy at 34 weeks and 3 days, pre-eclampsia with severe features  Post operative diagnosis: Same  Anesthesia: Spinal  Anesthesiologist: Dr. Mal AmabileMichael Farley  Procedure: Primary low transverse cesarean section  Surgeon: Dr. Dois DavenportSandra Cane Farley  Assistant: Ashley LawsJennifer Farley CNM  Estimated blood loss: 1800 cc  Procedure:  After being informed of the planned procedure and possible complications including bleeding, infection, injury to other organs, informed consent is obtained. The patient is taken to OR #9 and given spinal anesthesia without complication. She is placed in the dorsal decubitus position with the pelvis tilted to the left. She is then prepped and draped in Farley sterile fashion. Farley Foley catheter is inserted in her bladder.  After assessing adequate level of anesthesia, we infiltrate the suprapubic area with 20 cc of Marcaine 0.25 and perform Farley Pfannenstiel incision which is brought down sharply to the fascia. The fascia is entered in Farley low transverse fashion. Linea alba is dissected. Peritoneum is entered in Farley midline fashion. An Alexis retractor is easily positioned.   The myometrium is then entered in Farley low transverse fashion, 2 cm above the vesico-uterine junction ; first with knife and then extended bluntly. Amniotic fluid is clear. We assist the birth of Farley female  infant in vertex presentation. Mouth and nose are suctioned. The baby is delivered. The cord is clamped and sectioned. The baby is given to the neonatologist present in the room. We then assist the birth of Farley female infant in footling breech presentation after rupturing membranes with abundant clear fluid.Mouth and nose are suctioned.The cord is clamped and sectioned. The baby is given to the neonatologist present in the room.   10 cc of blood is drawn from the umbilical vein of both cords properly identified.The placentas are allowed to deliver spontaneously. They are complete and the  cords have 3 vessels each. Uterine revision is negative.  We proceed with closure of the myometrium in 2 layers: First with Farley running locked suture of 0 Vicryl, then with Farley Lembert suture of 0 Vicryl imbricating the first one. Hemostasis is completed with cauterization on peritoneal edges.  Both paracolic gutters are cleaned. Both tubes and ovaries are assessed and normal. The pelvis is profusely irrigated with warm saline to confirm Farley satisfactory hemostasis.  Retractors and sponges are removed. Under fascia hemostasis is completed with cauterization. The fascia is then closed with 2 running sutures of 0 Vicryl meeting midline. The wound is irrigated with warm saline and hemostasis is completed with cauterization. The skin is closed with Farley subcuticular suture of 3-0 Monocryl and Steri-Strips.  Instrument and sponge count is complete x2. Estimated blood loss is 800 cc. After cleaning and removal of the surgical drapes, I was called back in the room for excessive vaginal bleeding. Bimanual uterine massage, evacuation of clots and Cytotec 1000 mcg per vaginal succeeded in controlling the post-partum hemorrhage. The patient remained stable with normal vital signs.   The procedure is well tolerated by the patient who is taken to recovery room in Farley well and stable condition.  female baby named Ashley Farley was born at 15:17 and received an Apgar of 9  at 1 minute and 9 at 5 minutes and weighed 4 lbs 8 oz. Female baby named Ashley Farley was born at 15:18 and received an Apgar of 8 at 1 minute and 8 at 5 minutes and weighed 4 lbs 13 oz.   Specimen: Placenta sent to pathology   [facility] CityRIVARD,Ashley Ahart A MD 7/15/20155:15 PM

## 2013-12-23 NOTE — Progress Notes (Signed)
Dr Estanislado Pandyivard in patient room discussing plan of care with patient and significant other. Risk and benefits of vaginal delivery and cesarean section being explained with both pt and significant other stating an understanding.

## 2013-12-23 NOTE — Anesthesia Postprocedure Evaluation (Signed)
  Anesthesia Post-op Note  Patient: Ashley Farley  Procedure(s) Performed: Procedure(s): CESAREAN SECTION (N/A)  Patient Location: PACU  Anesthesia Type:Spinal  Level of Consciousness: awake, alert  and oriented  Airway and Oxygen Therapy: Patient Spontanous Breathing  Post-op Pain: none  Post-op Assessment: Post-op Vital signs reviewed, Patient's Cardiovascular Status Stable, Respiratory Function Stable, Patent Airway, No signs of Nausea or vomiting, Pain level controlled and No backache  Post-op Vital Signs: Reviewed and stable  Last Vitals:  Filed Vitals:   12/23/13 1730  BP: 125/80  Pulse: 95  Temp: 36.7 C  Resp: 17    Complications: No apparent anesthesia complications

## 2013-12-23 NOTE — OR Nursing (Signed)
Dr Rivard called to room for greater than normal bleeding/clots during fundal massage. Approx 500ml out during massage, with more each push. MD performed fundal massage, and gave pt 1[place] [facility] 31<MEASUREMECarolinaPMemorial Hermann 67Fi844098Lajoyce CorMoDeloO48513CDer650 537BerdinAdvanceMarla13.04MoKorMonica Ma44rtinSpectrum Health Gerber Mem265wUniversity Laveda Emh Regional Medica28Chatuge RegCharlett BlakRenatoTexas Delford FieldFrench AnShanda KoColl7260iW0J(4Brayton CaKindred Ho<MELiberty-Dayton Regional Medical CenterAS8Alvarado Parkway [phone] BoLolly MustacMaryelizabeKentuckyCorry Memorial Hospi34tBelShLewisburg Plastic Surgery And Laser Centerirl16109RoHarrington ChA740-He25Select Specialty Hospit<MEASURProvidence St Joseph Medical CenterE75Pione73er203-881Reuel BoLolly MustacMaryelizabeKentuckyTrinity Surgery Center LLC Dba Baycare Surgery Cen15tBelShBaylor Scott & White Emergency Hospital At Cedar Parkirl16109HGKnHarrington ChA979 He57Saint Francis SurgElgie Collardsna Postalley MedicalNarda [phone] 562KirEnloe Rehabili<Saint Camillus Medical Cen10<MEASUREMECarolina1Oz714098Lajoyce CorMoDeloO8648ChSoDer270 04BerdinAdvanceMarla13.04MoKorMonica Ma84rtinRobert Wood Johnson University Hospital Som5037wSt. Jude MedicaLaveda Mission Regional Medica20Glbesc LLC Dba Memorialcare Outpatient Surgical CenCharlett BlakRenatoTexas Delford FieldFrench AnShanda KoColl3374iW0J7Brayton CaCopiah County Medical Charlotta Ne2wOklahoma Outpatient Surgery Limited PRinaldo C47lSelect Specialty Hospital - Orlando SouthEClaRonneDarAmbulatory 72Su49PaHolCovington - Amg Rehabilitation Hospi2<MEASUREMECarolinaEast ShaBay Area40 E334098Lajoyce CorMoDeloO49502ChDiamondhMarland KitchDer([phone](BerdinAdvanceMarla13.09MoKorMonica Ma60rtinHackettstown Regional Medical C6471wNovamed Surgery Center OLaveda Endocentre Of B1Advanced Center FCharlett BlakRenatoTexas Delford FieldFrench AnShanda KoColl5755iW0J(7Brayton CaSouthwest Healthcare SysteCharlotta Ne26wDigestive <MEASUREMENT63Selec7tEast Margreta JoKentuckTimor-TSt Anthony North Health 75CCarCenter For Digestive Health LtdolaUniversity Endosc475301-5860Reuel BoLolly MustacMaryelizabeKentuckyBoston Outpatient Surgical Suites 20LBelShLandmark Hospital Of Southwest Floridairl16109GOHarrington ChA(763) He24[facility]cy Hospit<MEASUREMENT49Foster G Mcgaw [facility] L40oCannoMBeltway Surgery Centers LLC Dba Eagle Highlands Surgery CenterargAleda E. Lutz Va Medi2533140981LaAlbany Urology Surgery Center LLC Dba Albany Urology Su7rg909-6860Reuel BoLolly MustacMaryelizabeKentuckyCoffee County Center For Digestive Diseases 63LBelShVidant Medical Group Dba Vidant Endoscopy Center Kinstonirl16109West Harrington ChA702 He68Novant Health Rowan MediElgie Collardsna Postrula 562KirHca Houston Healthcare Pearland Medical CentersAndaR<Kindred Hospital-Den26<MEASUREMECarolinCo17as394098Lajoyce CorMoDeloO(16917ChMaMarland KitchenyDonnalee CuLoJohns Hopkins [phone](BerdinAdvanceMarl13.02MoKorMonica Ma36rtinUspi Memorial Surgery C4867wSj East Campus LLC Asc Dba Denver SurgerLaveda Ashford Presbyterian Community Hosp42Midtown MedicCharlett BlakRenatoTexas Delford FieldFrench AnShanda KoColl363iW0J6Brayton CaVa Southern Nevada HealthcarCharlotta Ne91wK Hovnanian ChildrenRinaldo C42lHeart Hospital Of New MeKe25rr10PaHolHighlands Medical Cen11<MEASUREMECarolinAthens Gastroentero3lo674098Lajoyce CorMoDeloO12317ChMarland KitcDer(720) 74BerdinAdvanceMarla13.07MoKorMonica Ma34rtinDigestive Care Center Evans3376wRidgewood Surgery And Endoscopy CeLaveda Walnut Hill Medica73Public Health SeCharlett BlakRenatoTexas Delford FieldFrench AnShanda KoColl6795iW0J9Brayton CaBaylor Scott & White Medical CeCharlotta Ne59wHouston Methodist BaytowRinaldo C38lPerimeter Center For Outpatient Surgery LPEClaRonneDarin EngeSa19iL22PaHolCentura Health-St Thomas More Hospi31<MEASUREMECarolinaBu81Jo94098Lajoyce CorMoDeloO34405ChCliffwoMarDer(419)20BerdinAdvanceMarla13.06MoKorMonica Ma57rtinLincoln Trail Behavioral Health S7242wNaval Hospital Laveda Joliet Surgery Center Limited Par34PhiladeLPhia Va Charlett BlakRenatoTexas Delford FieldFrench AnShanda KoColl1232iW0J8Brayton CaSun Behavioral Charlotta Ne13wSpecialists One Day Surgery LLC <MEASUREMENT36Margreta Kindred Hospital MelbourneJoKAmbulatory Endoscopy Center O2533040981NoHealthcare Partner Ambula10to973873Reuel BoLolly MustacMaryelizabeKentuckyPacific Rim Outpatient Surgery Cen74tBelShMuncie Eye Specialitsts Surgery Centerirl16109AlGMounHarrington ChA484-He59Northwood Deaconess <MEASUREME51NMountCentura Health-Porter Adventist Hospital CMSouthern California [phone] BoLolly MustacMaryelizabeKentuckyBehavioral Health Hospi23tBelShAustin Va Outpatient Clinicirl16GWilHarrington ChA229-He39Heart And Vascular Surgical Elgie Collardsna Post [phone] 562KirPromedica Wildwood Orthopedica A<Tallahassee Outpatient Surgery Center At Capital Medical Comm64<MEASUREMECarolinaADever87eu284098Lajoyce CorMoDeloO52986CMarland KitchenhDonnalee CuLoreDer385 434BerdinAdvanceMarla13.00MoKorMonica Ma13rtinBaylor Scott & White Medical Center - Pfluger6825wSanford Medical CentLaveda Clovis Community Medica50Specialty SurgCharlett BlakRenatoTexas Delford FieldFrench AnShanda KoColl1059iW0J8Brayton CaMorton Plant North Bay HCharlotta Ne59wWest Florida Medical CenterRinaldo C61lAntelope Valley Surgery Center LPEClaRonneRegio15nal91PHolBaptist Hospitals Of Southeast Te34<MEASUREMECarolinaHoStarpoint Surgery68 C724098Lajoyce CorMoDeloO(11931Marland KitchenCDDer787-7BerdinAdvanceMarla13.09MoKorMonica Ma85rtinVerde Valley Medical C5723wAuburn Regional MedicaLaveda Hca Houston Healthcare 79Fayetteville Ar Va Charlett BlakRenatoTexas Delford FieldFrench AnShanda KoColl3304iW0Brayton CaSt Vincent Seton Specialty Hospital LCharlotta Ne14wBig South Fork MediRinaldo C59lSouthfield Endoscopy Asc LLCEClaRonneDarTexas Health Center For 39Di45PaHolKentucky Correctional Psychiatric Cen36<MEASUREMECarolinaChippewa County 33Wa604098Lajoyce CorMoDeloO39864ChDMarlDer9034BerdinAdvanceMarla13.00MoKorMonica Ma20rtinColiseum Psychiatric Hos379wMarietta Outpatient SurLaveda Arizona Spine & Joint 39Surgcenter Of Palm BeaCharlett BlakRenatoTexas Delford FieldFrench AnShanda KoColl6318iW0J(30Brayton CaParkridge Medical Charlotta Ne44wSurgery Cente<MEASUREMENT85Coler-Goldwater Specialty [facility] & Nursing36 VMargreta JoKentuckTiCentro De Salud Comunal De CulebramorAscension Macomb Oakland Hosp-War2534840Texas Health Presby37te786-6869Reuel BoLolly MustacMaryelizabeKentuckyCommunity Hospital Onaga And St Marys Cam10pBelShAdena Regional Medical Centerirl16109North GSouthHarrington ChA(404) He28Jervey E<MEASUREMENT254CBearMargreta JoKentuc69kT862-5839Reuel BoLolly MustacMaryelizabeKentuckySelect Specialty Hospital - Midtown Atla87nBelShHalifax Health Medical Centerirl16109BlGHarrington ChA613 He17Endo Group LLC Dba [place] SElgie Collardsna Postlhambr2Gifford MedicalNarda [phone] 562KirLakes R<Select Specialty Hospital - Spectrum Hea83<MEASUREMECarolinaSeGads31de604098Lajoyce CorMoDeloO5677Marland KitcDer(601) 412BerdinAdvanceMarla13.08MoKorMonica Ma46rtinAurora West Allis Medical C3922wSt. VincenLaveda St. Vincent Rehabilitation 45Spectra EyeCharlett BlakRenatoTexas Delford FieldFrench AnShanda KoColl763iW0J6Brayton CaCitizens Memorial HospitalvDarTheCharlotta Ne3wThomasville SurgRinaldo C72lTuscaloosa Va Medical CenterEClaBaylor Institute For R81eh12PaHolBrodstone Memorial H34<MEASUREMECaroli52Ma674098Lajoyce CorMoDeloO(7089)ChMarlDer(331)808BerdinAdvanceMarla13.07MoKorMonica Ma38rtinNorthwest Gastroenterology Clini7448wLakeside Ambulatory Surgical CeLaveda San Joaquin General 47Lake Mary SurgCharlett BlakRenatoTexas Delford FieldFrench AnShanda KoColl15iW0J9Brayton CaUs Army Hospital-YuCharlotta Ne4<MEASUREMENT6Capi76tWestoCapital Orthopedic Surgery Center LLCnMaSha90rp([phone] BoLolly MustacMaryelizabeKentuckyParkridge West Hospi53tBelShMassachusetts Ave Surgery Centerirl1610GHarrington ChA35He26Extended Care Of SouthwestElgie Collardsna Postands-Cashiers HNarda [phone] 562KirGreenspring <Surgeyecare 19<MEASUREMECarolinaSouth Bea77ch644098Lajoyce CorMoDeloO679Der(984)BerdinAdvanceMarla13.09MoKorMonica Ma3rtinUniversity Of Maryland Medicine As4572wSonoma West MedicaLaveda Hazard Arh Regional Medica45Middlesex EndoscCharlett BlakRenatoTexas Delford FieldFrench AnShanda KoColl5436iW0J2Brayton CaNorth Ms State HospiCharlotta Ne49wEvergreen Endoscopy Rinaldo C4lVa Sierra Nevada Healthcare SysteKindred Ho50sp37PaHolGi Asc 72<MEASUREMECarolinaMonHos62pi154098Lajoyce CorMoDeloO14915ChDer365-49BerdinAdvanceMarla13.06MoKorMonica Ma72rtinPlateau Medical C5339wCopley Memorial Hospital Inc Dba Rush Copley MedicaLaveda Ruxton Surgice79New York-Presbyterian Hudson VCharlett BlakRenatoTexas Delford FieldFrench AnShanda KoColl5526iW0J7Brayton CaBeth Israel Deaconess Medical Center - WCharlo<MEASUREMENT50Continuecare Hosp10iWhiMAmbulatory Surgery Center Of LouisianaargDignity Health [phone] BoLolly MustacMaryelizabeKentuckyNevada Regional Medical Cen63tBelShOsmond General Hospitalirl16109WeGLincoln Harrington ChA346-He75Henry Ford Ma<MEASUREM78MargreFrontenac Ambulatory Surgery And Spine Care Center LP Dba Frontenac Surgery And Spine Care Centerta Upland [phone] BoLolly MustacMaryelizabeKentuckyChildren'S Medical Center Of Dal58lBelShThe Physicians' Hospital In Anadarkoirl16109Eagle GStroHarrington ChA239-He105Salt Lake Regional MediElgie Collardsna Postth Paoli HNarda [phone] 562KirHazel Hawkins Memo<Adventist Health Vall39<MEASUREMECarolinaCedLakeview Beh80av654098Lajoyce CorMoDeloO(20928ChBeDer819-114BerdinAdvanceMarla13.02MoKorMonica Ma73rtinSt. Joseph'S Behavioral Health C6899wTucson SurgerLaveda Heart Hospital O62ShermanCharlett BlakRenatoTexas Delford FieldFrench AnShanda KoColl3110iW0J7Brayton CaHeaton Laser And SurgerCharlotta Ne65wBeth Israel Deacones<MEASUREMENT63Gi W67eWestMargrFreestone Medical CenteretaCamden Genera2537We20st718866Reuel BoLolly MustacMaryelizabeKentuckyThe Ridge Behavioral Health Sys3tBelShTwin County Regional Hospitalirl16109MackinaGDonHarrington ChA859 He75Seton Shoal CreeElgie Collardsna [phone] 562KirMethodist Medical Center Of [place]<Phs Indian Hospital-Fort Belknap At Harlem-98<MEASUREMECarolinSouth Central Re83gi354098Lajoyce CorMoDeloO4555MarDer(224) 1BerdinAdvanceMarla13.03MoKorMonica Ma21rtinLakewood Health C7188wLafayette Behavioral HeaLaveda Saint Barnabas Behavioral Healt36Ascension Good SamaCharlett BlakRenatoTexas Delford FieldFrench AnShanda KoColl8705iW0J7Brayton CaCook MedicCharlotta Ne62wCove SurgRinaldo C33lHancock County HospitalEClaRonBaton62 R63PaHolPhysicians Surgery Center Of [place] 72<MEASUREMECarolinaPKaise31r 84098Lajoyce CorMoDeloO40303ChBoilingMarland KiDer707-02BerdinAdvanceMarla13.03MoKorMonica Ma82rtinAmbulatory Surgical Center Of Southern Nevad3010wMedstar-Georgetown University MedicaLaveda Encompass Health Rehabilitation Hospital Of Mo82Neospine Puyallup SpCharlett BlakRenatoTexas Delford FieldFrench AnShanda KoColl4823iW0J3Brayton CaMuskeCharlotta Ne55wKirkland Correctional InstitutionRinaldo C36lBap<MEASURE39MHenMargreta JoKentuRoane Medical CenterckTTexas E52nd(224)3875Reuel BoLolly MustacMaryelizabeKentuckyChildren'S Hospital Mc - College H56iBelShVibra Hospital Of Western Massachusettsirl16109MoHarrington ChA985-He24Yakima GastroenterologyElgie Collardsna Postarda 34Am11610962616Trula 562KirVeterans Administration Medical<Physicians Surgery Center Of Tempe LLC Dba Physicians Surgery Center Of Te72<MEASUREMECarolinaAdv6oc694098Lajoyce CorMoDDer601-7BerdinAdvanceMarla13.00MoKorMonica Ma65rtinMedical/Dental Facility At Par3347wAshley County MedicaLaveda Kindred [facility] - Las Vegas At Desert Spr49The Outpatient CeCharlett BlakRenatoTexas Delford FieldFrench AnShanda KoColl6615iW0J2Brayton CaEast Metro Charlotta Ne72wSacred HRinaldo C87lPrimary Children'S Medical 25Ce59PaHolWarm Springs Rehabilitation Hospital Of Westover Hi70<MEASUREMECarolinaDDepartment Of State 55Ho674098Lajoyce CorMoDeloDer769 71BerdinAdvanceMarla13.04MoKorMonica Ma88rtinOphthalmology Surgery Center Of Dalla6166wWest Tennessee Healthcare Rehabilitation Hospital CaLaveda Hunt Regional Medical Center Gr45Hardin MemCharlett BlakRenatoTexas Delford FieldFrench AnShanda KoColl6716iW0Brayton<MEASUREME56NEvanMaCape Cod Asc LLCrgrSummit Endosc6753854-2842Reuel BoLolly MustacMaryelizabeKentuckyJohnson County Health Cen79tBelShOptima Specialty Hospitalirl16109WayGCrooHarrington ChA(240)He69Hca Houston Healthc<MEASUREM74ENicMargreta Rehabiliation49 H361-3835Reuel BoLolly MustacMaryelizabeKentuckyAlexian Brothers Medical Cen36tBelShHoffman Estates Surgery Center LLCirl161Harrington ChA(201)He16Wellspan Gettys<MEASUREMENT52Lafayette Regional Rehabilitation Hospital03R32Wy249-6886Reuel BoLolly MustacMaryelizabeKentuckyUtah Valley Regional Medical Cen10tBelShSelect Specialty Hospital - Tallahasseeirl1Harrington ChA(304)He9Onslow Memo<MEASUREMENT3161DAtrium Medical Center At CorinthDumBryli253Ssm Heal20th([phone])4819Reuel BoLolly MustacMaryelizabeKentuckyDanbury Surgical Center75 BelShSan Luis Obispo Surgery Centerirl16109BrooklynGCaneHarrington ChA724-He66Bridge<MEASUREMENT51Encompass Health Reh53aMargreta JoKentuckTimVancouver Eye Care Psor-Lohman Endoscopy 25374098An3de805-5868Reuel BoLolly MustacMaryelizabeKentuckyWilshire Center For Ambulatory Surgery 69IBelShLandmark Hospital Of Joplinirl161GEmerHarrington ChA830-He17The Ambulatory Surgery Center A<MEASUREMENT52Baylor ScoNorth Big Horn Hospital Districtt9tDhhs Phs Naihs Crownpoint Public Health Services India253714Hosp44 G702-583Reuel BoLolly MustacMaryelizabeKentuckySurgery Center Of Long Be50aBelShMonroe County Hospitalirl16109GardnervilleGBHarrington ChA226-He63Milwaukee Surgic<MEASUREMENT32AscensionMLillian M. Hudspeth Memorial HospitalargKindred Hospital - New Jersey - Mor345(217) 8844Reuel BoLolly MustacMaryelizabeKentuckyVibra Long Term Acute Care Hospi76tBelShGastro Specialists Endoscopy Center LLCirl161GHarrington ChA626-He26Toledo Clinic Dba Toledo Clinic Outpatient S75<Athens Endoscopy LLCWMaCedar Surgical Ass25343Willamette Surger63y [phone] BoLolly MustacMaryelizabeKentuckyPark Nicollet Methodist H74oBelShMt Airy Ambulatory Endoscopy Surgery Centerirl16109MGLittHarrington ChA702-He39Hiawatha CommunitElgie Collardsna Postula 562KirQueen Of The Valley Hospital - NapasAndaRaynelle Fann<BADTEMount CresteUJCountry SquiMarland KitchenrDon7995Korea4Altus BayHolJennie Stuart Medical Cen28<MEASUREMECarolinaFalGottsche 44Re394098Lajoyce CorMoDeloODer(579)346BerdinAdvanceMarla13.09MoKorMonica Ma42rtinSt Francis Mooresville Surgery Cente3385wEye Surgery Center Of NortherLaveda Eyes Of York Surgical Ce36Integris Canadian VCharlett BlakRenatoTexas Delford FieldFrench AnShanda KoColl1867iW0J(23Brayton CaVa S. Arizona HealthcarCharlotta Ne80wCorpus Christi Surgicare L<MEASUWinner Regional Healthcare CenterREMPromise Hospital253871Ch360 4893Reuel BoLolly MustacMaryelizabeKentuckyCovington County Hospi65tBelShChi St Lukes Health - Brazosportirl16109NorriGRemsenburgHarrington ChA(937)He64Bertrand ChaffeElgie Collardsna PostortNarda 37Am116109615-310Trula 562KirMetairie Ophthalmology As<Uh Portage - Robinson Memorial Hospi77<MEASUREMECarolChi32 H394098Lajoyce CorMoDeloO56623ChNMarland KitchDer815-36BerdinAdvanceMarla13.09MoKorMonica Ma87rtinMillinocket Regional Hos741wLarned State Laveda Landmark Medica23Harrison Charlett BlakRenatoTexas Delford FieldFrench AnShanda KoColl6978iW0J9Brayton CaIrvine Endoscopy And Surgical Institute Dba United Surgery CenteCharlotta Ne72wBronx-Lebanon Hospital Center - FultoRinaldo C57lGastroenterology Care IncEClaRonneDarin EngeRichmond URi72ve74PaHolBakersfield Heart Hospi53<MEASUREMECarolinOrthoatlanta Surgery Cente15r 734098Lajoyce CorMoDeloO40440ChDer801-814BerdinAdvanceMarla13.05MoKorMonica Ma75rtinNorton Women'S And Kosair Children'S Hos6142wOrthopedic Surgery Center OLaveda Tmc He65Re[address] SurgiCharlett BlakRenatoTexas Delford FieldFrench AnShanda KoColl7624iW0J5Brayton CaEly Bloomenson CCharlotta Ne22wPhiladeLP83hKentuck48ySteMountM8724Lolly Mustac161KentuckyRidgeview Lesueur Medical CenterRMapleA973-717Elgie Collardsna Post2KirsAndaRaynelle FannLonestar Ambulatory SurgicalJaquiRito DarrMemorial Hermann [place] International Endoscopy Center Dba [place] International Endoscopy Cen86<MEASUREMECarolinaLake in thSt Lukes Hospita16l 394098Lajoyce CorMoDeloO36585ChDer(917)26BerdinAdvanceMarla13.01MoKorMonica Ma85rtinWeed Army Community Hos3560wLakewood Surgery CeLaveda Montefiore Westchester Square Medica56Holland CommCharlett BlakRenatoTexas Delford FieldFrench AnShanda KoColl7337iW0J6Brayton CaLincoln Charlotta Ne10w<MEASUREMENT38N1MargBakersfield Specialists Surgical Center LLCretOceans Behavioral Hospit2535Blue Ridge Surgical CenNard40a 248-5846Reuel BoLolly MustacMaryelizabeKentuckyWenatchee Valley Hospital Dba Confluence Health Omak 50ABelShPioneer Community Hospitalirl1Harrington ChA(650)He42Surgery Center Of <MEASUREMENT79Endos70cBurlMargreta JoKentuckTimor-TMemorial Hermann Surgery Center The Woodlands LLP Dba MemoriRockford Orthopedic Surgery Centeral Trego County Lemke Memoria2535584A719-2828Reuel BoLolly MustacMaryelizabeKentuckyRapides Regional Medical Cen53tBelShSt Mary'S Of Michigan-Towne Ctrirl1610GWest GlenHarrington ChA810 He77Prisma Health HiLLCresElgie Collardsna Post9Am11610970734Trula 562KirPower County Hospital DistrictsAndaRa<Bronx Milner LLC Dba Empire State Ambulatory Surgery Cen81<MEASUREMECarolinEf31th174098Lajoyce CorMoDeloO(57912CMarlanDer321 643BerdinAdvanceMarla13.00MoKorMonica Ma3rtinRiveredge Hos3875wVidant Roanoke-Chowan Laveda Head And Neck Surgery Associates Psc Dba Center For Surgi59Winnie Community Hospital Dba Riceland Charlett BlakRenatoTexas Delford FieldFrench AnShanda KoColl2530iW0J(53Brayton CaPhysicians Surgery Center At Good Samaritan LLCharlotta59 PinMargrNovamed Management Serv36ic215-1863Reuel BoLolly MustacMaryelizabeKentuckyAdventhealth Dur30aBelShVance Thompson Vision Surgery Center Billings LLCirl1610Harrington ChA519-He45Essex<MEASUREMENT85North State Surgery CeMadera Community Hospitalnt6Ce70nt231-3842Reuel BoLolly MustacMaryelizabeKentuckyIntegrity Transitional Hospi78tBelShWilkes-Barre General Hospitalirl16109Harrington ChA216-He25PalmertoElgie Collardsna Post Carroll Parish HNarda [phone] 562KirMount Sinai<Colonial Outpatient Surgery Cen27<MEASUREMECarolinaOWest 25Co314098Lajoyce CorMoDel75oO4ChAptos Hills-LarkiMarland KitchennDonnaleDer419-604BerdinAdvanceMarla13.05MoKorMonica Ma84rtinTexas Health Heart & Vascular Hospital Arli7869wSt Lukes Endoscopy CenterLaveda Ridgecrest Regional Hospital Transitional Care & Rehabi60Putnam CCharlett BlakRenatoTexas Delford FieldFrench AnShanda KoColl5313iW0J4Brayton CaPam Specialty Hospital Of Corpus ChristiCharlotta Ne42wAdventhealth ZRinaldo C6lLake Travis 62Sa49PaHolliceDignity Health St. Rose Dominican NorthJDrowniMarland KitchennDon108Korea4Cleveland Eye And Laser Surgery CenHolAdvanced Endoscopy Center Gastroenterol28<MEASUREMECarolin41Si384098Lajoyce CorMoDelDer(617)841BerdinAdvanceMarla13.03MoKorMonica Ma77rtinRock Surgery Cente7574wUniversity Of Maryland Harford Memorial Laveda Ohio Valley Medica53Tupelo SurgCharlett BlakRenatoTexas Delford FieldFrench AnShanda KoColl4497iW0Brayton CaHoly F<MEASUREMENT73East 51MPinMargreta JoKentuckTimor-TSaTexas Health Presbyterian Hospital KaufmanintNorth Valley Endosc253Indiana University Health Bed62fo647-3825Reuel BoLolly MustacMaryelizabeKentuckySalem Regional Medical Cen26tBelShRegions Behavioral Hospitalirl16109Fort MoGBHarrington ChA984-He35Myrtue Memo<MEASUR47EFrMargreta JoKeDeaconess Medical CenterntuCo33mm732-3849Reuel BoLolly MustacMaryelizabeKentuckyJersey Shore Medical Cen93tBelShPlaza Ambulatory Surgery Center LLCirl16109ClHarrington ChA315 He104Montrose MemGeisinger Endoscopy And Surgery Ctro<MSoutheastern Ohio Regiona22l ([phone] BoLolly MustacMaryelizabeKentuckyWest Calcasieu Cameron Hospi36tBelShKaiser Fnd Hosp - Sacramentoirl16109GHarrington ChA424-He62Michiana Behavioral HeaElgie Collardsna Posty HNarda 76Am116109605 728Trula 562KirMethodist HospitalsAndaRa<Harbor Beach Community Hospi68<MEASUREMECarolinaZapatMichiga37n 814098Lajoyce CorMoDeloO(319Der828-BerdinAdvanceMarla13.07MoKorMonica Ma74rtinEndoscopy Center Of South Jerse2464wNew York Presbyterian Morgan Stanley Children'S Laveda Regional West [place] County 19Hattiesburg Eye Clinic Catarct And Lasik SurgCharlett BlakRenatoTexas Delford FieldFrench AnShanda KoColl56iW0J8Brayton CaSchoolcraft MemoriaCharlotta <MEASUREMENT8431West BurlMargreta JoKentuckTimor-TKSpaulding Hospital For Continuing Med Care CambridgeernSanford Vermillio2536240Black Hills Surg33er845 1875Reuel BoLolly MustacMaryelizabeKentuckyNiobrara Health And Life Cen33tBelShCommunity Surgery And Laser Center LLCirl16109PGSouthHarrington ChA(719)He53WekiElgie Collardsna PostPartNarda 46Am116109705-385Trula 562KirThe Endoscopy Center Of <Advanced Surgery Cen71<MEASUREMECarolinaTunnA37ll884098Lajoyce CorMoDeloO(47050)ChMonsoMDer475-1820BerdinAdvanceMarla13.05MoKorMonica Ma46rtinLouisiana Extended Care Hospital Of Lafa3054wCape CodLaveda Fayetteville Asc Sca A5Woodcrest Charlett BlakRenatoTexas Delford FieldFrench AnShanda KoColl2701iW0J7Brayton CaFaxton-St. Luke'S Healthcare - St<MEASUREMENT48Montgomery Sur6gKaMargreta JoKentuckTimor-TPrWitham Health ServicesoviTempleton Surgery 3753(775) 3874Reuel BoLolly MustacMaryelizabeKentuckyEleanor Slater Hospi45tBelShContinuecare Hospital Of Midlandirl16109BHarrington ChA606-He69Del Val Asc Dba The Eye S<MEASURE36MUpper WitterMaDavita Medical GrouprgrSelect Specialt100y [phone] BoLolly MustacMaryelizabeKentuckyAcuity Specialty Hospital Of Southern New Jer47sBelShBeltline Surgery Center LLCirl1610Harrington ChA78He28Morristown MemoriaElgie Collardsna Postrda 84Am116109717-042Trula [address]KirGy[address] Endoscopy Suites In<Clifton T Perkins [facility] Cen54<MEASUREMECarolinaFountaThe University Of Ten38ne404098Lajoyce CorMoDeloO42804ChFullerMarlandDer(709)317BerdinAdvancMarla13.02MoKorMonica Ma2rtinArundel Ambulatory Surgery C1371wThe Physicians' Hospital In Laveda Marshfield Med Center - R9Gibson GeCharlett BlakRenatoTexas Delford FieldFrench AnShanda KoColl3470iW0J7Brayton CaDry Creek Surgery Charlotta Ne56wHospitRinaldo C35lThe Ent Center Of Rhode Island LLCEClaRonneDarinShriners Hos75pi28PaHolHancock County Health Sys68<MEASUREMECarolina45Pe594098Lajoyce CorMoDeloO(14564ChCalhoMarland KitDer256-0265(BerdinAdvanceMarla13.06MoKorMonica Ma53rtinSgmc Berrien C3544wSurgicare Surgical Associates Of MaLaveda Lifecare Hospitals Of San75Ty Cobb Healthcare System - Hart CCharlett BlakRenatoTexas Delford FieldFrench AnShanda KoColl3533iW0J(51Brayton CaWest River Regional MedicCharlotta Ne70wSycamore MediRinaldo C68lSelect Specialty Ho<MEASUREMENT5The Surgery Center Dba Advanced Surgical Care1StAthens Surgery [phone] BoLolly MustacMaryelizabeKentuckyGeisinger -Lewistown Hospi25tBelShCarmel Ambulatory Surgery Center LLCirl1GAleHarrington ChA(845)He15The Endoscopy Cent<MEASUREMENT35Verde Valley50 Wixon Margreta JoKentucTimor-Methodist Mansfield Medical CenterTWeMethodist Ambulatory Surgery Ho4sp445-288Reuel BoLolly MustacMaryelizabeKentuckyProwers Medical Cen5tBelShGulf Coast Endoscopy Center Of Venice LLCirl16109SGNew MiamiHarrington ChA272-He28Eye SurInst Medico Del Norte Inc, Centro Medico Wilma2532Riverwalk Asc116109682-3862Reuel BoLolly Mustac161KentuckyForest Canyon Endoscopy And Surgery Ctr PShirl16109JerA319 2064Elgie Collardsna PostannGlen Lehman Endosc16109JaquiRito Darre55Despina HiddAllyson SBowdle Healthc38<MEASUREMECarolinaMarlboro Bismarck Su19rg154098Lajoyce CorMoDeloO(524Marland KitcDer30635BerdinAdvanceMarla13.04MoKorMonica Ma55rtinThe Burdett Care C2181wAllegheney Clinic Dba Wexford SurgerLaveda St. Francis 70Christus St. MichaelCharlett BlakRenatoTexas Delford FieldFrench AnShanda KoColl3680iW0J(7Brayton CaAlfred I. Dupont HospitaCharlotta Ne23wIndiana University Health Ball MemoriaRinaldo C65lMease Dunedin 51Ho68PaHolVidant Medical Group Dba Vidant Endoscopy Center Kins66<MEASUREMECarolinaBaywoSan Miguel Corp Alta Vi58st914098Lajoyce CorMoDeloO36856ChEast WMarland KitcheniDonnDer386-111BerdinAdvanceMarl13.06MoKorMonica Ma23rtinCape Fear Valley Hoke Hos1066wPioneer Medical CentLaveda Hurst Ambulatory Surgery Center LLC Dba Precinct Ambulatory Surgery Ce12Encompass Health Rehabilitation HospitCharlett BlakRenatoTexas Delford FieldFrench AnShanda KoColl73iW0J8Brayton CaCoCharlotta Ne7<MEASUREMEN57TWhitMargreta JoKentucTimor-TRosMurrells Inlet Asc LLC Dba Candor Coast Surgery Centere50 M803-8825Reuel BoLolly MustacMaryelizabeKentuckyEndoscopy Center Of Toms Ri46vBelShMerrit Island Surgery Centerirl161Harrington ChA701-He36Sanford Health Detroit Lakes Same Day SElgie Collardsna Post County HealthNarda [id number] 562KirWestchester Medi<Mercy Rehabilitation Hospital Springfi19<MEASUREMECaroliGrand Street70 G94098Lajoyce CorMoDer650 57BerdinAdvanceMarla13.00MoKorMonica Ma46rtinAvera Saint Lukes Hos2423wGladiolus Surgery CeLaveda The Orthopedic Specialty 3Great Lakes Eye SurgCharlett BlakRenatoTexas Delford FieldFrench AnShanda KoColl5262iW0J3Brayton CaEdwards CounCharlotta Ne3wReston HospiRinaldo C24lMemorial HospitalECla32Ro49PaHolAsheville-Oteen Va Medical Cen62<MEASUREMECarolinaLake Morton-BeSurgical Center6 O704098Lajoyce CorMoDeloO6762Marland KitchDer(785) 75BerdinAdvancMarla13.09MoKorMonica Ma73rtinGastroenterology Consultants Of San Anton5466wBarnes-Jewish West County Laveda Va [facility] - Ma39[facility] LLC Dba Baylor Scott And White Surgicare At Charlett BlakRenatoTexas Delford FieldFrench AnShanda KoColl4611iW0Brayton CaExcela Health FricCharlotta Ne44wBelmont Center F<MEASUREMENT51Providence LitSt. Anthony'S Hos4pi([phone] BoLolly MustacMaryelizabeKentuckyJefferson Cherry Hill Hospi62tBelShCary Medical Centerirl16109GrHarrington ChA607-He8Sierra Vista Regional M<MEASUREMENT33Upmc15 BMarSwisher Memorial 84Ho313-1873Reuel BoLolly MustacMaryelizabeKentuckyAscension Ne Wisconsin Mercy Cam46pBelShThomas Johnson Surgery Centerirl16109BloGHarrington ChA607-He41Fla<MEA36SSutMargreta JoKentuckTSanford Health Sanford Clinic Watertown Surgical CtrimoJupiter Outpatient Surgery 253904Tomok30a [phone] BoLolly MustacMaryelizabeKentuckyRiver Valley Ambulatory Surgical Cen50tBelShSelect Specialty Hospital Central Pennsylvania Yorkirl161Harrington ChA701-He87Marshfield Medical Ctr NElgie Collardsna Post980-448Trula 562KirSouth Georgia Medical CentersAndaRaynelle Fa<Kiowa District Hospi27<MEASUREMECar76ol214098Lajoyce CorMoDeloO67517CMarland KitcDer445BerdinAdvanceMarla13.01MoKorMonica Ma48rtinAtrium Health S6052wLafayette General Surgical Laveda Marion General 63Teaneck Gastroenterology And EnCharlett BlakRenatoTexas Delford FieldFrench AnShanda KoColl3716iW0Brayton CaMichigan Endoscopy Center <MEASUREMENT86Novamed Eye Surgery Center Of [place]ville LLC Dba 39EKirMargreta JoKentuckTimor-TNorthern ArTristar Ashland City Medical CenterizoEinstein Medical Center [id number] BoLolly MustacMaryelizabeKentuckyIndiana University Health Arnett Hospi21tBelShLaser And Outpatient Surgery Centerirl1610GNorthwest HaHarrington ChA(612) He48[place] Fl Orthopaedic Asc LLC Dba Orthopaedic SurgElgie Collardsna [phone] 562KirWhitman Hospital And Medical Cent<Sutter Auburn Faith Hospi56<MEASUREMECarolinaDBrownfield Re40gi94098Lajoyce CorMoDeloO(79667CDer905 7737BerdinAdvanceMarla13.05MoKorMonica Ma46rtinNew Gulf Coast Surgery Cente7040wMile Bluff Medical CeLaveda The Physicians Surgery Center Lancaster Gen56Methodist Charlett BlakRenatoTexas Delford FieldFrench AnShanda KoColl435iW0Brayton CaBoone CountCharlotta Ne62wShrine<MEASUREMENUsmd Hospital At Arlington48TPlaza Ambulatory Surgery 25Citrus [phone] BoLolly MustacMaryelizabeKentuckyBaylor Surgicare At Granbury 5LBelShHiggins General Hospitalirl161GWeHarrington ChA(218) He78Abilene Regional MediElgie Collardsna Post 53Am116109424-290Trula 562KirWadley Regional Medical CentersAn<Guadalupe County Hospi51<MEASUREMECarolinaElEye Physic46ia414098Lajoyce CorMoDeDer417-79BerdinAdvanceMarla13.02MoKorMonica Ma63rtinLouisville Endoscopy C5071wPearl River County Laveda Burke Rehabilitatio51Zuni Comprehensive CommunityCharlett BlakRenatoTexas Delford FieldFrench AnShanda KoColl8346iW0J9Brayton CaCamp Lowell Surgery Center LLC Dba Camp Lowell SurgCharlotta Ne2<MEASUR69ELevel King'S Daughters' Ho110sp765-4825Reuel BoLolly MustacMaryelizabeKentuckySpokane Eye Clinic Inc43 BelShJervey Eye Center LLCirl16109KwHarrington ChA(807)He11Carlin Vision Surgery Elgie Collardsna PosteMarRiverview Psychiat2537840981HuCNarda [phone] [phone]<Surgical Center Of Connecti74<MEASUREMECarolinaLakNei Ambulatory 27Su84098Lajoyce CorMoDeloO137Der949-292BerdinAdvanceMarla13.05MoKorMonica Ma68rtinBloomington Surgery C5772wGrafton City Laveda Speciality Surgery Cente40Baptist Memorial HospiCharlett BlakRenatoTexas Delford FieldFrench AnShanda KoColl267iW0J3Brayton CaJohn C S<MEASUREMENT3DoubMargretaBaylor Scott And White Pavilion JoRocky Mountain Eye Surgery 253293640938-1855Reuel BoLolly MustacMaryelizabeKentuckyVa Central Ar. Veterans Healthcare System39 BelShKaiser Permanente Surgery Ctrirl16109Harrington ChA(812) He48Carolinas Rehabilitatio<MEASUREMENT59C75oBallengerMargreta JoKenTallahassee Memorial HospitaltucBaptist Emergency Hos69pi520-8828Reuel BoLolly MustacMaryelizabeKentuckyTexas Health Seay Behavioral Health Center Pl24aBelShHocking Valley Community Hospitalirl16109CGSHarrington ChA430-He69Squaw Peak Surgical<MEASUREMENT5757Margreta JoJfk Medical CenterKenSylvan Surgery54 2406-1865Reuel BoLolly MustacMaryelizabeKentuckyKaiser Permanente Woodland Hills Medical Cen81tBelShEncompass Health Rehabilitation Hospital The Woodlandsirl16109BlHarrington ChA804-He53Odessa Endosco<MEASURE38Margreta JoKentuckTimor-TTPhiladeLPhia Va Medical CenteretoWhite Mountain Regional Medi25325Teleca16re519 4819Reuel BoLolly MustacMaryelizabeKentuckyAcuity Hospital Of South Te49xBelShAnamosa Community Hospitalirl16109EGLHarrington ChA(760)He52Surgery Cent<MEASUREMENT35MemorRenaissance Hospital Terrellia3Adventist Health Walla Walla Genera253714098Southwell Ambulatory61 I([phone] BoLolly MustacMaryelizabeKentuckyRoanoke Ambulatory Surgery Center 26LBelShSouthwest Medical Associates Inc Dba Southwest Medical Associates Tenayairl16109DoHarrington ChA207 He19Pend Oreille Surgery Elgie Collardsna PostndoscopyNarda 35Am116109434-407Trula 562KirLas Palmas Medical C<Private Diagnostic Clinic P29<MEASUREMECarolinaSad62dl904098Lajoyce CorMoDeloO(18405ChAlMarland KiDer(574)36BerdinAdvanceMarla13.06MoKorMonica Ma70rtinUniv Of Md Rehabilitation & Orthopaedic Inst5568wBethesda Chevy Chase Surgery Center LLC Dba Bethesda Chevy Chase SurgerLaveda Terre Haute Regional 4Gastrointestinal Diagnostic EndoscopyCharlett BlakRenatoTexas Delford FieldFrench AnShanda KoColl4770iW0J9Brayton CaDec4Margreta JoKentuckTimEastpointe Hospitalor-Franciscan Alliance Inc Fra37nc737861Reuel BoLolly MustacMaryelizabeKentuckySt. David'S South Austin Medical Cen78tBelShBeverly Hills Endoscopy LLCirl16109RGMaHarrington ChA87He48State Hill SElgie Collardsna PostospitalNarda 69Am116109803-156Trula 562KirNorthwest Eye Special<Tri Valley Health Sys4<MEASUREMECarolEmusc LLC Db58a 494098Lajoyce CorMoDeloO3640ChMarDer780-BerdinAdvanceMarla13.08MoKorMonica Ma77rtinCrete Area Medical C156wCurahealth Hospital OLaveda O33Ascension St JCharlett BlakRenatoTexas Delford FieldFrench AnShanda KoColl3353iW0J4Brayton CaStarr Regional Medical CenteCharlotta Ne40wAccel Rehabilitation HospitaRinaldo C36lAdvocate South Suburban Hospita69lE28PaHolCrete Area Medical Cen29<MEASUREMECarolinDoctors Medical Center-Behavio13ra654098Lajoyce CorMoDeloO57434CMarland KitchenhDonnalee CuDer660-4BerdinAdvanceMarla13.00MoKorMonica Ma74rtinGood Samaritan Hospital-Bakers176wDuke University Laveda Lancaster Behavioral Health 48Saint ElizaCharlett BlakRenatoTexas Delford FieldFrench AnShanda KoColl1677iW0J8Brayton CaEndCharlotta Ne60wThree Rivers <24MMargreCottage Rehabilitation Hospita49lt([phone])2891Reuel BoLolly MustacMaryelizabeKentuckyErlanger East Hospi40tBelShHill Country Surgery Center LLC Dba Surgery Center Boerneirl161GRHarrington ChA325 He2Gladiolus Surgery Elgie Collardsna Postdant Chowan HNarda 73Am116109916-238Trula 562KirNorth Pines Sur<First Gi Endoscopy And Surgery Center 26<MEASUREMECarolinaNaFort Belvo29ir264098Lajoyce CorMoDeloO(3779)ChMarDer(254)435BerdinAdvanceMarla13.03MoKorMonica Ma34rtinOld Vineyard Youth Ser6766wWest Michigan Surgical CeLaveda Wyoming Medica66SacrCharlett BlakRenatoTexas Delford FieldFrench AnShanda KoColl8652iW0J3Brayton Ca<MEASUREMENT35Moore SMargreta JoOur Lady Of Lourdes Regional Medical CenterKenCentura Health-St Thomas Mor2537Curahealth NaNarda [id number] BoLolly MustacMaryelizabeKentuckyMt San Rafael Hospi27tBelShBeacon West Surgical Centerirl16109Harrington ChA318-He4Olympia Eye CliElgie Collardsna Postst Hospital HenryettasAndaRaynelle FannHospital District No 6 O<Cook Children'S Medical Cen27<MEASUREMECaroliJohn Ra66nd564098LajoyDer(709)16(BerdinAdvanceMarla13.08MoKorMonica Ma40rtinSelect Specialty Hospital Southeast202wAccess Hospital DayLaveda Ankeny Medical Park SuPhysicians SurgCharlett BlRenatoTexas Delford FieldFrench AnShanKoColl5910iW0J3Brayton CaDavis Medical CenteCharlotta Newto1nBaptist Health Medical Center - Hot SprRinaldo C24Sampson Reg59Lake Endoscopy Center 47<MEASUREMECarolinaKSt 40Ri584098Lajoyce CorMoDeloO29910Marland KitchenCDonDer(615)852BerdinAdvanceMarla13.01MoKorMonica Ma28rtinOklahoma Outpatient Surgery Limited Partne4019wCavhcs WesLaveda Group Health Eastside 10Southwest Healthcare SCharlett BlakRenatoTexas Delford FieldFrench AnShanda KoColl7645iW0J(7Brayton CaAdvanced Endoscopy And SurgicalCharlotta Ne37wFredericksburg Ambulatory Surgery Rinaldo C71lResolute HealthEClaRonneDarin EngeGaMa78ngu49PHolNorthern Colorado Rehabilitation Hospi42<MEASUREMECarolinaCarmel Spe70ci704098Lajoyce CorMoDeloO52365CDer865 737BerdinAdvanceMarla13.07MoKorMonica Ma65rtinPhysicians Surgery Servic1747wVision One Laser And Surgery CeLaveda Surgical Eye Center Of San27Upmc Susquehanna SoldCharlett BlakRenatoTexas Delford FieldFrench AnShanda KoColl1311iW0J3Brayton CaDayton Va MedicaCharlotta Ne55w<MEASU14RRollingMargreta JoKentuckTiSmith County Memorial HospitalmorReception And Medical63 C813-483Reuel BoLolly MustacMaryelizabeKentuckyPhysicians Surgery Center At Good Samaritan 15LBelShKalkaska Memorial Health Centerirl16109North BaGHarrington ChA872 He35Outpatient Surgery Cente<MEASUREMENT73Vantage Surgical Associates55 DeMargreta JoShriners Hospitals For Children - CincinnatiKenNemaha Valley Communit25Sutter82 C276-3816Reuel BoLolly MustacMaryelizabeKentuckySaint Thomas Campus Surgicare59 BelShBeacon Behavioral Hospital-New Orleansirl16109Blue RidgGCHarrington ChA(248)He89Kurt G<MEASUREMENT68Cent19rRosMargreta JoKentuckTimoVenice Regional Medical Centerr-TSentara Care30pl([phone] BoLolly MustacMaryelizabeKentuckyPennsylvania Hospi48tBelShGreenwood Leflore Hospitalirl16Harrington ChA(331)He75Mountain Valley Regional Rehabilita<MEASUREMOsf Holy Family Medical CenterENTChinle Comprehens53iv(506)7866Reuel BoLolly MustacMaryelizabeKentuckySaint Peters University Hospi29tBelShBaylor Scott & White Medical Center - Marble Fallsirl16109StaGPaHarrington ChA(272)He38[address]-Jewish St. PeterElgie Collardsna Posty MedicalNarda [phone] 562KirBennett County Heal<Iowa City Va Medical Cen57<MEASUREMECarolinaStar Vi10ew194098Lajoyce CorMoDeloO312Der609-746BerdinAdvanceMarla13.07MoKorMonica Ma81rtinEssentia Health Vir3254wBriarcliff Ambulatory Surgery Center LP Dba Briarcliff SurgerLaveda Mohawk Valle35Va Medical CCharlett BlakRenatoTexas Delford FieldFrench AnShanda KoColl51iW0J8Brayton CaComanche CountCharlotta Ne61wCincinn<MEASUREM89EDutch MargretGreen Spring Station Endoscopy LLCa JRegional Health C23us251856Reuel BoLolly MustacMaryelizabeKentuckyJackson County Public Hospi70tBelShBetter Living Endoscopy Centerirl16109BowHarrington ChA928-He55Valley Surgical Elgie Collardsna Postrda 37Am116109219-590Trula 562KirCommunity Health Center Of Bra<Sheepshead Bay Surgery Cen82<MEASUREMECarolinaDoctors Neur35op144098Lajoyce CorMoDeloO41580ChMouMarland KitchennDonnaleeChDer(256)533BerdinAdvanceMarla13.09MoKorMonica Ma30rtinPrairie Ridge Hosp Hlth7166wSixty Fourth StLaveda [place] Psychiatri17Hawaii Charlett BlakRenatoTexas Delford FieldFrench AnShanda KoColl555iW0J(7Brayton CaBuffalo PsychCharlotta Ne23wTristar Greenview RegionaRinaldo C84lThe En55do47PaHolDigestive Health Center Of Indiana64<MEASUREMECarolinaPaChambersburg89 E684098Lajoyce CorMoDeloO28505ChStaceMarland KitcheDer(223)187BerdinAdvanceMarla13.02MoKorMonica Ma47rtinSalinas Valley Memorial Hos846wBaptist HealthLaveda Novant Health Huntersville Medica26P[place]ville Charlett BlakRenatoTexas Delford FieldFrench AnShanda KoColl5360iW0J7Brayton CaCarroll County Ambulatory SurgCharlotta Ne36wAltus LuRinaldo C63lBarnesville Hospi<MEASUREMEN19TWMargreta JoKentuckTimor-TNorth Shore SAmbulatory Surgical Center LLCameThe Endoscopy Center Of253Naab Ro12ad([phone] BoLolly MustacMaryelizabeKentuckyMuscogee (Creek) Nation Physical Rehabilitation Cen64tBelShWasatch Endoscopy Center Ltdirl1610GSanHarrington ChA331-He68Johnson County SurgeryElgie Collardsna Post9210 423Trula 562KirAmbulatory Surgery Center At Indiana Eye Cl<Select Specialty Hospital-Birming22<MEASUREMECarolinaCombee SetCenter For Ad75va284098Lajoyce CorMoDeloO81402ChJeffeMarlanDer445-679BerdinAdvanceMarla13.05MoKorMonica Ma62rtinCentral Vermont Medical C7176wScottsdale Liberty Laveda Yakima Gastroenterology A2Mountains CommCharlett BlakRenatoTexas Delford FieldFrench AnShanda KoColl3886iW0J8Brayton CaUh Canton<MEASUREMENTSaint Francis Hospital Muskogee11ES24hr(469) 2813Reuel BoLolly MustacMaryelizabeKentuckyRiverside Medical Cen35tBelShMildred Mitchell-Bateman Hospitalirl16109Head of thGLake ArHarrington ChA626-He92St Jose<MEASUREMENT27CoHafa Adai Specialist Groupr77Gottleb Memorial Hospital Loyola Health System A253584B7ay404-0849Reuel BoLolly MustacMaryelizabeKentuckyBaylor Surgicare At Oakm44oBelShMillennium Surgery Centerirl16GDarHarrington ChA(862) He61St Louis Specialty SuFrio Regional Hospital<77ME309-6869Reuel BoLolly MustacMaryelizabeKentuckyWatsonville Surgeons Gr14oBelShHahnemann University Hospitalirl161GStHarrington ChA225-He18Bertrand Cha<MEASUREMENT61Uc Health Ambulatory Surgical Center Inverness Ortho26pIroMargreta JoKentuckTimor-TWestern Washington Medical GroupSierra Vista Hospital EnMinde23n 443-5871Reuel BoLolly MustacMaryelizabeKentuckySpringhill Surgery Cen72tBelShRegional Urology Asc LLCirl16109ZGEast WiHarrington ChA205-He35St Fra<MEASUREMENT4057SDeMargrWestern State HospitaletaAnne Arundel Medi256538947-3876Reuel BoLolly MustacMaryelizabeKentuckyNorton Women'S And Kosair Children'S Hospi16tBelShLds Hospitalirl16109CGVHarrington ChA707-He10Belleair Surge<MEASUREMENT19Valley 39FKnoMargreta JoKentuckTimor-TCity Of Hope Helford ClinicV Covinton LLC Dba Lake Behavioral Hospitalal Sj East Campus LLC Asc Dba Denver79 S769-3855Reuel BoLolly MustacMaryelizabeKentuckyPhysicians Surgery Cen21tBelShLehigh Valley Hospital Hazletonirl16GMenHarrington ChA763-He93St. Mary<MEASUREMENT46Texas Health Su1rChMargreta JoKentPullman Regional HospitaluckWab25as623 5855Reuel BoLolly MustacMaryelizabeKentuckySt Mary'S Medical Cen73tBelShAurora Las Encinas Hospital, LLCirl16109GoodGFeHarrington ChA661 He29The Eye Elgie Collardsna Postildren'S Hospital FoNarda [id number] 562KirNew York P<Regional Medical Of San J66<MEASUREMECarolinaMnh G54i 654098Lajoyce CorMoDeDer(586)54BerdinAdvanceMarla13.02MoKorMonica Ma4rtinVermilion Behavioral Health S8244wRed River SurgerLaveda Ascension Se Wisconsin Hospital - Frankli57Hays Charlett BlakRenatoTexas Delford FieldFrench AnShanda KoColl5880iW0J4Brayton CaActd LLC Dba Green Mountain Surgery CCharlotta Ne5wEastern Connecticut EndoscRinaldo C54lThe Rehabilitation Institute Of St. LouisEClRonneDarin 22En29PaHolLouisville Surgery Cen38<MEASUREMECarolinaABassett Ar56my644098Lajoyce CorMoDeloO72636ChLoMarland KDer21324BerdinAdvanceMarla13.07MoKorMonica Ma11rtinWest Gables Rehabilitation Hos3778wMemorial Laveda Southeast Louisiana Veterans Health Car28Regency HosCharlett BlakRenatoTexas Delford FieldFrench AnShanda KoColl6152iW0J(9Brayton CaMattax Neu Prater SurgerCharlotta Ne58wDown East CommunitRinaldo C54lClear Lake Surgicare Lt29dE32PaHolBaylor Surgical Hospital At Las Coli5<MEASUREMECarol50in624098Lajoyce CorMoDeloO34870ChPleasMarland KitcDer319-331BerdinAdvanceMarla13.04MoKorMonica Ma40rtinSurgical Specialty Center At Coordinated H7776wLea Regional MedicaLaveda Central Delaware Endoscopy 46Mallard Creek Charlett BlakRenatoTexas Delford FieldFrench AnShanda KoColl622iW0J3Brayton CaCarilion Franklin Memorial HCharlotta Ne23wCataract Specialty SurgiRinaldo C34lCedars Surgery CeP79el81PaHolRiverwoods Behavioral Health Sys52<MEASUREMECarolinaWasGlen92 E774098Lajoyce CorMoDeloO517Marland KitcheDer959-40BerdinAdvanceMarla13.02MoKorMonica Ma3rtinSpencer Municipal Hos56106wUpson Regional MedicaLaveda Promise Hospital Of S66New JerseyCharlett BlakRenatoTexas Delford FieldFrench AnShanda KoColl4606iW0J6Brayton CaMercy HospCharlotta Ne81wBear <MEAMethodist Medical Center Of Oak RidgeSURSurgicare Of Man2534Ar33ka([phone] BoLolly MustacMaryelizabeKentuckyEllis Health Cen11tBelShFamily Surgery Centerirl16109SHarrington ChA24He35Centra Lynchburg GeneraElgie Collardsna Postl Of JoNarda 49Am116109216-755Trula 562KirMemorial Community Ho<Associated Eye Care Ambulatory Surgery Center 40<MEASUREMECarolinaBanOchsner Extended Ca31re304098Lajoyce CorMoDeloO(9690MarlandDer463-615BerdinAdvanceMarla13.05MoKorMonica Ma23rtinAngel Medical C5076wSan Ramon Regional MedicaLaveda Stafford 62Kidspeace OrcharCharlett BlakRenatoTexas Delford FieldFrench AnShanda KoColl8212iW0J(3Brayton CaCentura Health-Porter AdventCharlotta Ne86wPoplar Bluff Regional MediRinaldo C85lPost Acute Specialty Hospital Of LafayetteEClaRonne40DE46PaHolLovelace Westside Hospi27<MEASUREMECarolinaBishJupiter Outpatie41nt544098Lajoyce CorMoDer639-65BerdinAdvancMarl13.05MoKorMonica Ma24rtinGs Campus Asc Dba Lafayette Surgery C4838wSelect Specialty Hospital GuLaveda Recovery Innovatio61Gulf Coast EnCharlett BlakRenatoTexas Delford FieldFrench AnShanda KoColl57iW0J3Brayton CaRenue SuCharlotta Ne24wSalem Memorial DistricRinaldo C21lMarietta Eye SurgeryEClaRonneDEye S36ur36PaHolSidney Regional Medical Cen75<MEASUREMECaroliSpec35tr264098Lajoyce CorMoDeloO57970Marland KitchenCDonnalee CuLoreChi St LDer562-114(BerdinAdvancMarla13.08MoKorMonica Ma73rtinCalifornia Eye C6922wAdvanced Surgery Center Of Northern LouisLaveda Endoscopy Center Of Northwest Con79Pam Rehabilitation HosCharlett BlakRenatoTexas Delford FieldFrench AnShanda KoColl3936iW0J(97Brayton CaCrane Creek Surgical PaCharlotta Ne41wTh<MEASUREMENT877GMargreta JoKenPheLPs Memorial Hospital CentertucRiverside Walter Ree253734098611M209 5864Reuel BoLolly MustacMaryelizabeKentuckyCleveland Cli36nBelShIndiana Spine Hospital, LLCirl16109EHarrington ChA901-He39Va Salt [place] City Healthcare - George E. Wahlen Va MediElgie Collardsna Postutpatient Narda [id number] 798Trula 562KirJennings American <Greenwood Leflore Hospi29<MEASUREMECarolinaS39Io434098Lajoyce CorMoDeloO52902LoSurgery Center ODer941-67BerdinAdvanceMarla13.01MoKorMonica Ma52rtinSaint Barnabas Behavioral Health C5330wHurst Ambulatory Surgery Center LLC Dba Precinct Ambulatory Surgery CeLaveda Edwardsville Ambulatory SurgerOklahoma Heart Charlett BlRenatoTexas Delford FieldFrench AnShanKoColl5388iW0J(6Brayton CaGastroenterology Diagnostic Center MCharlotta Newto89nCentro De Salud Integral DRinaldo C69Saint Josephs Wayn82Regional General Hospital Willis88<MEASUREMECarolinaSouth SPlainfie32ld864098Lajoyce CorMoDeloO(23313ChRMarland KitchenuDoDer(603) 242BerdinAdvanceMarla13.00MoKorMonica Ma75rtinSurgcenter Of White Mars9352wSouth Coast Global MedicaLaveda Roper St Francis Berkeley 19MargarCharlett BlakRenatoTexas Delford FieldFrench AnShanda KoColl1486iW0J(7Brayton CaBaptist Physicians SCharlotta Ne24wOasis SurgeryRinaldo C40lUrology Surgical Partners LLCEClaRo35nM57PaHolTurquoise Lodge Hospi25<MEASUREMECarolinCompass Behav82io454098Lajoyce CorMoDeloO(35714ChMarland KitchenMDoDer254-410BerdinAdvanceMarla13.06MoKorMonica Ma62rtinPenn Highlands D5317wMedical Arts Laveda Advanced Surgical Institute Dba South Jersey Musculoskeletal Insti55Reynolds Army CommCharlett BlakRenatoTexas Delford FieldFrench AnShanda KoColl764iW0J3Brayton CaNortheast Endoscopy CCharlotta Ne110wHernando Endoscopy And SurgRinaldo C36lLowndes Ambulatory Surgery CenterEClaRonnePremier Sp81ec63PaHolConejo Valley Surgery Center 35<MEASUREMECarolinaNottoway CourTrego County Le20mk204098Lajoyce CorMoDeloO54480ChGrovMDer727-239BerdinAdvanceMarla13.07MoKorMonica Ma38rtinHouston Behavioral Healthcare Hospita4739wBoise Va MedicaLaveda Barbourville Arh 60Beverly Hills Doctor SCharlett BlakRenatoTexas Delford FieldFrench AnShanda KoColl397iW0J6Brayton CaSouth Texas RehabilitatiCharlotta Ne52wAdvanced Endoscopy Rinaldo C37lArbour Hospital, TSierra Vi11st63PaHolGuthrie Corning Hospi44<MEASUREMECarolinaJMedstar National Re85ha284098Lajoyce CorMoDeloO67773ChConcorMarland KitchDer236 271(BerdinAdvanceMarla13.09MoKorMonica Ma42rtinBrooks County Hos7472wPipeline Wess Memorial [facility] Dba Louis A Weiss Memorial Laveda Midtown Surgery Ce28Drumright RegCharlett BlakRenatoTexas Delford FieldFrench AnShanda KoColl728iW0J6Brayton CaPower County HospCharlotta Ne11wSullivan Coun<MEASUREMENT230PMargreta JoKentuckTimor-Naval Hospital GuamTChNorth Atlantic S66ur([phone] BoLolly MustacMaryelizabeKentuckyScripps Mercy Surgery Pavil50iBelShSan Diego Eye Cor Incirl16109ChestGGrassHarrington ChA(734)He69Lemuel SattucElgie Collardsna PostNarda 55Am116109661-644Trula 562KirGreenville Surgery Center LL<Texas Regional Eye Center Asc 29<MEASUREMECarolinaSaScotland Memorial Hospital An32d 614098Lajoyce CorMoDel16oO9ChVillMarland KitcheneDonnDer843-845BerdinAdvanceMarla13.03MoKorMonica Ma47rtinMayo Clinic Health Sys W6964wCopley Memorial Hospital Inc Dba Rush Copley MedicaLaveda Lifecare Hospitals Of [place] - Mcall88Baptist Memorial Hospital Charlett BlakRenatoTexas Delford FieldFrench AnShanda KoColl6267iW0J7Brayton CaYuma <MEASUREMENT5Priscilla Chan & Mark Zuckerberg San Francisco G64eHMargGoldstep Ambulatory Surgery Center LLCretBaptist Healt2535740981ApBaylor52 S772-1811Reuel BoLolly MustacMaryelizabeKentuckyHca Houston Healthcare Kingw1oBelShCornerstone Hospital Of Huntingtonirl16109MarleneGColoniHarrington ChA(463)He51Surger37yKentuck80yBay PLakeBaltimore Eye Surgical814Lolly Mustac161KentuckyAdvanced Endoscopy And Surgical Center LLCRMoA970 174Elgie Collardsna Postlle FannNorthwest Georgia Orthopaedic Surgery CenJaquiRito DarreRocky Mountain Endoscopy Centers 88<MEASUREMECarolinO78ak394098Lajoyce CorMoDeloO1693Marland Kitchen9Donnalee CuLorenda 815BaylDer(713) 227BerdinAdvanceMarla13.07MoKorMonica Ma70rtinVal Verde Regional Medical C10054wCarroll County Ambulatory SurgicaLaveda North Mississippi Medical Center We62Central Coast EndoscCharlett BlakRenatoTexas Delford FieldFrench AnShanda KoColl5644iW0J2Brayton CaTampa Bay Surgery Center Dba Center For Advanced SurgicalCharlotta Ne84wNorth Suburban S<MEASUREMENT58Vall37eIndiaMaDelta Medical CenterrgrCoast Plaza Doctor25Wel34lb734-4875Reuel BoLolly MustacMaryelizabeKentuckyHuntingdon Valley Surgery Cen74tBelShSouthern Surgery Centerirl16109PlHarrington ChA66He31White County Medical Center -<MEASUREDenver West [facility] LLCMENWellbridge [id number] BoLolly MustacMaryelizabeKentuckyAntietam Urosurgical Center LLC 78ABelShSamaritan Lebanon Community Hospitalirl16109Luis M.GClHarrington ChA98He16M[facility] Medical CenElgie Collardsna Posty CenNarda [id number] 562KirSan Antonio Surgicenter<[facility]pi42<MEASUREMECarolinaQuaThe Surgical Center 32Of454098Lajoyce CorMoDeloO43601ChMarland KitchenFDonnalee CDer380-322BerdinAdvanceMarla13.05MoKorMonica Ma71rtinVa [facility] Fort 10853wOchsner Medical Center NorthsLaveda [facility] 62Beth Israel Deaconess Medical CenterCharlett BlakRenatoTexas Delford FieldFrench AnShanda KoColl31073iW0J(8Brayton CaAdventist Healthcare White Oak MedCharlotta Ne60<MEASU8RNorth DMargreta JBaylor Emergency Medical CenteroKePanola Endoscopy [phone] BoLolly MustacMaryelizabeKentuckyNorth East Alliance Surgery Cen57tBelShNicklaus Children'S HospitalirlHarrington ChA437-He76[facility] S FlElgie Collardsna Postenter At OakbNarda [phone] 562KirAnson General <Rose [facility] Cen79<MEASUREMECarolinaFranciscan St Anthony41 H204098Lajoyce CorMoDeloO792Marland KiDer7052BerdinAdvanceMarla13.05MoKorMonica Ma52rtinSevier Valley Medical C6864wSt. Rose Dominican Hospitals - SienLaveda [facility]ut54[facility]an Charlett BlakRenatoTexas Delford FieldFrench AnShanda KoColl10874iW0J5Brayton CaSurgical Specialty Center Of WesCharlotta Ne65wCentrum Surgery Rinaldo C29lUnity Medical CenterEClaCat78skil8PHolDickinson County Memorial Hospi47<MEASUREMECarolinaBCuraheal24th404098Lajoyce CorMoDeloO(57726ChCandlewood LMarland KitchenaDonnaleeDer415-50BerdinAdvanceMarla13.06MoKorMonica Ma73rtinOakland Physican Surgery C8452wOrangeLaveda South Count26SurgceCharlett BlakRenatoTexas Delford FieldFrench AnShanda KoColl520iW0J(6Brayton CaRobert Wood Johnson University HoCharlotta Ne51wThe [facility] PhysicianRinaldo C4lCascade Valley Arlington Surgery CenterEClaRonnVa Maryland Heal16th70PaHolWalden Behavioral Care, 65<MEASUREMECarolinThe Physicians Surgery Center 14La504098Lajoyce CorMoDeloO4767CDer([phone])477BerdinAdvanceMarla13.07MoKorMonica Ma49rtinPalos Hills Surgery C22wChapman MedicaLaveda [facility] 71OwCharlett BlakRenatoTexas Delford FieldFrench AnShanda KoColl3196iW0J(71Brayton CaTripler Army Medica<MEASUREMENT14Trevose Spe31cMargreta Peak One Surgery CenterJoKPuget Sound Gastroent2535[place] Co58mm802-6824Reuel BoLolly MustacMaryelizabeKentuckySanford Canby Medical Cen5tBelShSt Joseph'S Westgate Medical Centerirl16Harrington ChA313-He27[facility] Heal<MEASUREMENT42Advanced Surgery 76CDuMargreta JoKentuckTimor-Taravista Behavioral Health [phone] BoLolly MustacMaryelizabeKentuckyAtrium Medical Cen40tBelShBarlow Respiratory Hospitalirl1610GCumberlandHarrington ChA707 He9[facility] Elgie Collardsna Post740Hemet EnNarda [phone] 562KirMercy HospitalsA<[facility] Hospi50<MEASUREMECarolinaKent Foothill Presbyterian HosDer([phone])5BerdinAdvanceMarla13.02MoKorMonica Ma40rtinBirmingham Surgery C32wSt. James Parish Laveda Healthmark Regional MeWisconsin DigestiveCharlett BlRenatoTexas Delford FieldFrench AnShanKoColl18iW0J6Brayton CaEncino Surgical Charlotta Newto68nEncompass Health Rehabilitation Hospital OfRinaldo C63Medical [facility] Dba [place] Surgery Cente74rEClRonnIredel58PamDaryel NovEffieOrlan Leaven 

## 2013-12-23 NOTE — Progress Notes (Signed)
Hospital day # 7 pregnancy at [redacted]w[redacted]d for Di/Di twins/IVF pregnancy with mild Pre-E.   S:  Reports headache with no relief after Fioricet and Stadol, denies changes in vision or RUQ pain, reports good fetal activity Perception of contractions: none perceived  Vaginal bleeding: none  Vaginal discharge: no significant change   O:  Vitals:  BP 138/76  Pulse 63  Temp(Src) 97.7 F (36.5 C) (Axillary)  Resp 20  Ht 5' (1.524 m)  Wt 179 lb 11.2 oz (81.511 kg)  BMI 35.10 kg/m2  SpO2 96% 0400 - 149/92 0600 - 162/127 0643 10 mg Labetalol given IV 0700 - 153/90 0800 - 138/76  Fetal tracings:  Cat I x 2 Contractions: None graphed, occasional irritablity Uterus gravid and non-tender  Extremities: edema 3+ pitting, no signs of DVT or clonus, DTR 2  Labs:  Results for orders placed during the hospital encounter of 12/16/13 (from the past 24 hour(s))  CBC     Status: Abnormal   Collection Time    12/23/13  6:05 AM      Result Value Ref Range   WBC 13.9 (*) 4.0 - 10.5 K/uL   RBC 3.95  3.87 - 5.11 MIL/uL   Hemoglobin 13.1  12.0 - 15.0 g/dL   HCT 16.1  09.6 - 04.5 %   MCV 96.2  78.0 - 100.0 fL   MCH 33.2  26.0 - 34.0 pg   MCHC 34.5  30.0 - 36.0 g/dL   RDW 40.9  81.1 - 91.4 %   Platelets 133 (*) 150 - 400 K/uL  COMPREHENSIVE METABOLIC PANEL     Status: Abnormal   Collection Time    12/23/13  6:05 AM      Result Value Ref Range   Sodium 138  137 - 147 mEq/L   Potassium 4.1  3.7 - 5.3 mEq/L   Chloride 105  96 - 112 mEq/L   CO2 20  19 - 32 mEq/L   Glucose, Bld 98  70 - 99 mg/dL   BUN 16  6 - 23 mg/dL   Creatinine, Ser 7.82  0.50 - 1.10 mg/dL   Calcium 8.8  8.4 - 95.6 mg/dL   Total Protein 5.3 (*) 6.0 - 8.3 g/dL   Albumin 2.0 (*) 3.5 - 5.2 g/dL   AST 37  0 - 37 U/L   ALT 18  0 - 35 U/L   Alkaline Phosphatase 171 (*) 39 - 117 U/L   Total Bilirubin <0.2 (*) 0.3 - 1.2 mg/dL   GFR calc non Af Amer 87 (*) >90 mL/min   GFR calc Af Amer >90  >90 mL/min   Anion gap 13  5 - 15  URIC ACID      Status: Abnormal   Collection Time    12/23/13  6:05 AM      Result Value Ref Range   Uric Acid, Serum 10.5 (*) 2.4 - 7.0 mg/dL  LACTATE DEHYDROGENASE     Status: Abnormal   Collection Time    12/23/13  6:05 AM      Result Value Ref Range   LDH 347 (*) 94 - 250 U/L   Meds:  Aspirin chewable tablet 81 mg, PO, Daily          Given: 07/14 2219  Butalbital-acetaminophen-caffeine (FIORICET, ESGIC) 50-325-40 MG per tablet 2 tablet, PO, Q8H  Given: 07/15 0614  Docusate sodium (COLACE) capsule 100 mg, PO, Daily        Given: 07/09 0948  Famotidine (PEPCID)  tablet 20 mg, PO, Daily         Given: 07/13 1034  Folic acid (FOLVITE) tablet 0.5 mg, PO, Daily         Given: 07/14 2219  Prenatal multivitamin tablet 1 tablet, PO, Q2200        Given: 07/14 2219  Progesterone (PROMETRIUM) capsule 200 mg, VA, Q2200       Given: 07/14 2219  Sodium chloride 0.9 % injection 3 mL 3 mL, IV, Q12H        Given: 07/14 2219  Continuous Infusions: LR  PRN Meds:.acetaminophen, calcium carbonate, labetalol, zolpidem   A: 857w3d Di/Di twins/IVF pregnancy with mild Pre-E  BPs elevated this morning, responded well to Labetalol HA unrelieved by medication  PIH labs drawn this morning - results above  P: Continue current plan of care  Upcoming tests/treatments: U/S ordered for 7/22 MDs will follow

## 2013-12-23 NOTE — Progress Notes (Signed)
Pt. And significant other are opting to have cesarean section performed.

## 2013-12-23 NOTE — Anesthesia Preprocedure Evaluation (Addendum)
Anesthesia Evaluation  Patient identified by MRN, date of birth, ID band Patient awake    Reviewed: Allergy & Precautions, H&P , NPO status , Patient's Chart, lab work & pertinent test results, reviewed documented beta blocker date and time   Airway Mallampati: III TM Distance: >3 FB Neck ROM: Full    Dental no notable dental hx. (+) Teeth Intact   Pulmonary neg pulmonary ROS,  breath sounds clear to auscultation  Pulmonary exam normal       Cardiovascular hypertension, Pt. on medications Rhythm:Regular Rate:Normal     Neuro/Psych  Neuromuscular disease negative neurological ROS  negative psych ROS   GI/Hepatic negative GI ROS, Neg liver ROS,   Endo/Other  negative endocrine ROS  Renal/GU Renal disease  negative genitourinary   Musculoskeletal negative musculoskeletal ROS (+)   Abdominal (+) + obese,   Peds  Hematology Mild thrombocytopenia   Anesthesia Other Findings   Reproductive/Obstetrics (+) Pregnancy Di/Di twin gestation S/P IVF 34 43/7 weeks Severe pre eclampsia                          Anesthesia Physical Anesthesia Plan  ASA: III and emergent  Anesthesia Plan: Spinal   Post-op Pain Management:    Induction:   Airway Management Planned: Natural Airway  Additional Equipment:   Intra-op Plan:   Post-operative Plan:   Informed Consent: I have reviewed the patients History and Physical, chart, labs and discussed the procedure including the risks, benefits and alternatives for the proposed anesthesia with the patient or authorized representative who has indicated his/her understanding and acceptance.     Plan Discussed with: Anesthesiologist, CRNA and Surgeon  Anesthesia Plan Comments:         Anesthesia Quick Evaluation

## 2013-12-23 NOTE — Progress Notes (Addendum)
S:  Pt reports HA started on Monday afternoon and has become progressively worse.  Pt finished a Coke about 1150, at 1230 pt reports HA at a 10/10.  HA is not a consistent intensity of pain but decreases and increases between a 7 and a 10.    Pt is also reporting continuing occasional UCs for the last week or so, and one intense UCs around 1215.  Ceasar Mons: Filed Vitals:   12/23/13 [id number] 12/23/13 0853 12/23/13 0940 12/23/13 1207  BP: 138/76 139/71  115/78  Pulse: 63 63  67  Temp:  97.8 F (36.6 C)  97.9 F (36.6 C)  TempSrc:  Oral  Oral  Resp:  16  18  Height:      Weight:   180 lb 3.2 oz (81.738 kg)   SpO2:       FHT: Cat I x 2 UCs: 1 noted at 1215  Dilation: 3 Effacement (%): 70 Station: -1 Presentation: Vertex (Baby A is vertex) Exam by:: Victorino DikeJennifer CNM  Headache is still 10/10 despite Fioricet x 2, Dilaudid and caffeine. Blurry vision without scotoma. Mild shortness of breath. No RUQ pain. NST reactive and category 1 x2 Lungs: clear Pitting edema 3+ with DTR 2/4 and no clonus  Assessment: Di-Di twin at 34+3 weeks s/p BMZ Pre-eclampsia now with severe features  Plan:  Recommend to proceed with delivery and Magnesium Sulfate IOL reviewed and questions answered: patient is a good candidate with Vertex/transverse and favorable cervix Cesarean section also discussed: patient and husband both desire to proceed with cesarean section mainly because patient is in significant discomfort and cannot foresee long hours of induction. Informed that length of labor is unpredictable but couple maintains its decision.  Procedure,R&B reviewed Dr Sherrie Georgeecker informed and agreeable with plan Reviewed with dr Malen GauzeFoster

## 2013-12-23 NOTE — Progress Notes (Signed)
Ur chart review completed.  

## 2013-12-24 ENCOUNTER — Encounter (HOSPITAL_COMMUNITY): Payer: Self-pay | Admitting: Obstetrics and Gynecology

## 2013-12-24 LAB — CBC
HCT: 27 % — ABNORMAL LOW (ref 36.0–46.0)
Hemoglobin: 9.3 g/dL — ABNORMAL LOW (ref 12.0–15.0)
MCH: 33.1 pg (ref 26.0–34.0)
MCHC: 34.4 g/dL (ref 30.0–36.0)
MCV: 96.1 fL (ref 78.0–100.0)
Platelets: 130 10*3/uL — ABNORMAL LOW (ref 150–400)
RBC: 2.81 MIL/uL — AB (ref 3.87–5.11)
RDW: 14.4 % (ref 11.5–15.5)
WBC: 19.9 10*3/uL — ABNORMAL HIGH (ref 4.0–10.5)

## 2013-12-24 LAB — COMPREHENSIVE METABOLIC PANEL
ALK PHOS: 130 U/L — AB (ref 39–117)
ALT: 72 U/L — ABNORMAL HIGH (ref 0–35)
ANION GAP: 10 (ref 5–15)
AST: 91 U/L — ABNORMAL HIGH (ref 0–37)
Albumin: 1.7 g/dL — ABNORMAL LOW (ref 3.5–5.2)
BUN: 15 mg/dL (ref 6–23)
CO2: 23 mEq/L (ref 19–32)
Calcium: 7.9 mg/dL — ABNORMAL LOW (ref 8.4–10.5)
Chloride: 100 mEq/L (ref 96–112)
Creatinine, Ser: 0.99 mg/dL (ref 0.50–1.10)
GFR calc Af Amer: 86 mL/min — ABNORMAL LOW (ref >90)
GFR calc non Af Amer: 74 mL/min — ABNORMAL LOW (ref >90)
GLUCOSE: 89 mg/dL (ref 70–99)
POTASSIUM: 5 meq/L (ref 3.7–5.3)
Sodium: 133 mEq/L — ABNORMAL LOW (ref 137–147)
Total Protein: 4.4 g/dL — ABNORMAL LOW (ref 6.0–8.3)

## 2013-12-24 LAB — MAGNESIUM: Magnesium: 6.1 mg/dL (ref 1.5–2.5)

## 2013-12-24 LAB — RPR

## 2013-12-24 MED ORDER — TRAMADOL HCL 50 MG PO TABS
100.0000 mg | ORAL_TABLET | Freq: Four times a day (QID) | ORAL | Status: DC | PRN
  Administered 2013-12-24: 100 mg via ORAL
  Filled 2013-12-24: qty 2

## 2013-12-24 NOTE — Progress Notes (Signed)
Subjective: Postpartum Day 1: Cesarean Delivery . Pre-eclampsia with severe features Patient reports that pain is well-managed. Denies headache,visual symptoms or RUQ pain Lochia normal. Tolerating diet as ordered without difficulty. No flatus. Absent bowel movement.  Objective: Vital signs in last 24 hours: Temp:  [97.9 F (36.6 C)-99.8 F (37.7 C)] 98.5 F (36.9 C) (07/16 0700) Pulse Rate:  [25-97] 62 (07/16 0800) Resp:  [14-18] 16 (07/16 0800) BP: (106-156)/(68-103) 135/83 mmHg (07/16 0800)  BP have remained within normal range since delivery SpO2:  [78 %-99 %] 92 % (07/16 0800) Weight:  [166 lb 14.4 oz (75.705 kg)-169 lb (76.658 kg)] 169 lb (76.658 kg) (07/16 0500)  I/O: diuresing at 100 cc/hour since 6:00 but +115 cc today   Physical Exam:  General: alert Lochia: appropriate Lungs: clear Uterine Fundus: firm and appropriately tender Incision: healing well DVT Evaluation: No evidence of DVT seen on physical exam. Edema 3+  DTR 2/4  Without clonus   Recent Labs  12/23/13 1900 12/24/13 0538  HGB 10.1* 9.3*  HCT 29.5* 27.0*   Results for orders placed during the hospital encounter of 12/16/13 (from the past 24 hour(s))  COMPREHENSIVE METABOLIC PANEL     Status: Abnormal   Collection Time    12/23/13  7:00 PM      Result Value Ref Range   Sodium 138  137 - 147 mEq/L   Potassium 4.6  3.7 - 5.3 mEq/L   Chloride 108  96 - 112 mEq/L   CO2 18 (*) 19 - 32 mEq/L   Glucose, Bld 100 (*) 70 - 99 mg/dL   BUN 15  6 - 23 mg/dL   Creatinine, Ser 8.290.86  0.50 - 1.10 mg/dL   Calcium 8.0 (*) 8.4 - 10.5 mg/dL   Total Protein 4.3 (*) 6.0 - 8.3 g/dL   Albumin 1.8 (*) 3.5 - 5.2 g/dL   AST 44 (*) 0 - 37 U/L   ALT 25  0 - 35 U/L   Alkaline Phosphatase 134 (*) 39 - 117 U/L   Total Bilirubin <0.2 (*) 0.3 - 1.2 mg/dL   GFR calc non Af Amer 88 (*) >90 mL/min   GFR calc Af Amer >90  >90 mL/min   Anion gap 12  5 - 15  CBC     Status: Abnormal   Collection Time    12/23/13  7:00 PM   Result Value Ref Range   WBC 17.8 (*) 4.0 - 10.5 K/uL   RBC 3.08 (*) 3.87 - 5.11 MIL/uL   Hemoglobin 10.1 (*) 12.0 - 15.0 g/dL   HCT 56.229.5 (*) 13.036.0 - 86.546.0 %   MCV 95.8  78.0 - 100.0 fL   MCH 32.8  26.0 - 34.0 pg   MCHC 34.2  30.0 - 36.0 g/dL   RDW 78.414.3  69.611.5 - 29.515.5 %   Platelets 126 (*) 150 - 400 K/uL  RPR     Status: None   Collection Time    12/23/13  7:00 PM      Result Value Ref Range   RPR NON REAC  NON REAC  CBC     Status: Abnormal   Collection Time    12/24/13  5:38 AM      Result Value Ref Range   WBC 19.9 (*) 4.0 - 10.5 K/uL   RBC 2.81 (*) 3.87 - 5.11 MIL/uL   Hemoglobin 9.3 (*) 12.0 - 15.0 g/dL   HCT 28.427.0 (*) 13.236.0 - 44.046.0 %   MCV 96.1  78.0 -  100.0 fL   MCH 33.1  26.0 - 34.0 pg   MCHC 34.4  30.0 - 36.0 g/dL   RDW 16.1  09.6 - 04.5 %   Platelets 130 (*) 150 - 400 K/uL  MAGNESIUM     Status: Abnormal   Collection Time    12/24/13  5:38 AM      Result Value Ref Range   Magnesium 6.1 (*) 1.5 - 2.5 mg/dL  COMPREHENSIVE METABOLIC PANEL     Status: Abnormal   Collection Time    12/24/13  5:38 AM      Result Value Ref Range   Sodium 133 (*) 137 - 147 mEq/L   Potassium 5.0  3.7 - 5.3 mEq/L   Chloride 100  96 - 112 mEq/L   CO2 23  19 - 32 mEq/L   Glucose, Bld 89  70 - 99 mg/dL   BUN 15  6 - 23 mg/dL   Creatinine, Ser 4.09  0.50 - 1.10 mg/dL   Calcium 7.9 (*) 8.4 - 10.5 mg/dL   Total Protein 4.4 (*) 6.0 - 8.3 g/dL   Albumin 1.7 (*) 3.5 - 5.2 g/dL   AST 91 (*) 0 - 37 U/L   ALT 72 (*) 0 - 35 U/L   Alkaline Phosphatase 130 (*) 39 - 117 U/L   Total Bilirubin <0.2 (*) 0.3 - 1.2 mg/dL   GFR calc non Af Amer 74 (*) >90 mL/min   GFR calc Af Amer 86 (*) >90 mL/min   Anion gap 10  5 - 15       Assessment/Plan: Status post Cesarean section. Doing well postoperatively.  Plan to D/C magnesium at 15:00 Plan of care reviewed with pt and husband   Johncarlos Holtsclaw A 12/24/2013, 9:42 AM

## 2013-12-24 NOTE — Addendum Note (Signed)
Addendum created 12/24/13 0803 by Turner DanielsJennifer L Shi Blankenship, CRNA   Modules edited: Notes Section   Notes Section:  File: [id number]

## 2013-12-24 NOTE — Lactation Note (Signed)
This note was copied from the chart of Ashley A Ruthmary Pierron. Lactation Consultation Note   Initial consult with this mom of NICU twins, now 19 hours old, and 34 4/7 weeks CGA. .The babies were conceived by IVF. DEP teaching done  , hand expression, and review of NICU booklet on how to provide EBm for the NICU baby. Mom was able to express a good drop of colostrum. On exam, her right breast is larger than her left. Her areolas and nipples are extremely dry and flaky, and itchy, according to mom. I applied lanolin, and gently wiped her breast with a warm wash cloth, and a lot of dry skin came off. Mom said she felt better. I will advise her to use olive oil or coconut oil at home(anti-fungal), as opposed to lanolin.  Parents very excited about their babies, and very receptive to teaching. Lactation services also reviewed with mom, and she knows to call for questions/concerns  Patient Name: Ashley Farley Today's Date: 12/24/2013 Reason for consult: Initial assessment;NICU baby;Infant < 6lbs;Late preterm infant;Multiple gestation   Maternal Data Formula Feeding for Exclusion: Yes (baby in NICU) Reason for exclusion: Admission to Intensive Care Unit (ICU) post-partum Infant to breast within first hour of birth: No Breastfeeding delayed due to:: Infant status Has patient been taught Hand Expression?: Yes Does the patient have breastfeeding experience prior to this delivery?: No  Feeding Feeding Type: Formula Length of feed: 15 min  LATCH Score/Interventions                      Lactation Tools Discussed/Used Tools: Pump Breast pump type: Double-Electric Breast Pump WIC Program: No Pump Review: Setup, frequency, and cleaning;Milk Storage;Other (comment) (premie setting hand expressiion) Initiated by:: bedside Rn Date initiated:: 12/24/13   Consult Status Consult Status: Follow-up Date: 12/25/13 Follow-up type: In-patient    Britlyn Martine Anne 12/24/2013, 1:39 PM    

## 2013-12-24 NOTE — Anesthesia Postprocedure Evaluation (Signed)
  Anesthesia Post-op Note  Anesthesia Post Note  Patient: Ashley Farley  Procedure(s) Performed: Procedure(s) (LRB): CESAREAN SECTION (N/A)  Anesthesia type: Spinal  Patient location: AICU  Post pain: Pain level controlled  Post assessment: Post-op Vital signs reviewed  Last Vitals:  Filed Vitals:   12/24/13 0700  BP: 117/96  Pulse: 62  Temp: 36.9 C  Resp: 16    Post vital signs: Reviewed  Level of consciousness: awake  Complications: No apparent anesthesia complications

## 2013-12-24 NOTE — Progress Notes (Signed)
UR chart review completed.  

## 2013-12-24 NOTE — Plan of Care (Signed)
Problem: Phase I Progression Outcomes Goal: OOB as tolerated unless otherwise ordered Outcome: Progressing Up to wheelchair, stand and turn at bedside. Pain in abdomen

## 2013-12-25 ENCOUNTER — Inpatient Hospital Stay (HOSPITAL_COMMUNITY): Payer: BC Managed Care – PPO

## 2013-12-25 LAB — CBC WITH DIFFERENTIAL/PLATELET
Basophils Absolute: 0 10*3/uL (ref 0.0–0.1)
Basophils Relative: 0 % (ref 0–1)
EOS PCT: 1 % (ref 0–5)
Eosinophils Absolute: 0.2 10*3/uL (ref 0.0–0.7)
HCT: 22.3 % — ABNORMAL LOW (ref 36.0–46.0)
Hemoglobin: 7.7 g/dL — ABNORMAL LOW (ref 12.0–15.0)
LYMPHS PCT: 19 % (ref 12–46)
Lymphs Abs: 3.2 10*3/uL (ref 0.7–4.0)
MCH: 34.4 pg — AB (ref 26.0–34.0)
MCHC: 34.5 g/dL (ref 30.0–36.0)
MCV: 99.6 fL (ref 78.0–100.0)
MONOS PCT: 6 % (ref 3–12)
Monocytes Absolute: 1 10*3/uL (ref 0.1–1.0)
NEUTROS ABS: 12.6 10*3/uL — AB (ref 1.7–7.7)
NEUTROS PCT: 74 % (ref 43–77)
PLATELETS: 151 10*3/uL (ref 150–400)
RBC: 2.24 MIL/uL — ABNORMAL LOW (ref 3.87–5.11)
RDW: 15.7 % — AB (ref 11.5–15.5)
WBC: 17 10*3/uL — ABNORMAL HIGH (ref 4.0–10.5)

## 2013-12-25 LAB — COMPREHENSIVE METABOLIC PANEL
ALT: 157 U/L — AB (ref 0–35)
AST: 149 U/L — ABNORMAL HIGH (ref 0–37)
Albumin: 1.8 g/dL — ABNORMAL LOW (ref 3.5–5.2)
Alkaline Phosphatase: 119 U/L — ABNORMAL HIGH (ref 39–117)
Anion gap: 11 (ref 5–15)
BUN: 13 mg/dL (ref 6–23)
CALCIUM: 8.2 mg/dL — AB (ref 8.4–10.5)
CHLORIDE: 101 meq/L (ref 96–112)
CO2: 26 meq/L (ref 19–32)
Creatinine, Ser: 0.86 mg/dL (ref 0.50–1.10)
GFR, EST NON AFRICAN AMERICAN: 88 mL/min — AB (ref >90)
GLUCOSE: 107 mg/dL — AB (ref 70–99)
Potassium: 4 mEq/L (ref 3.7–5.3)
Sodium: 138 mEq/L (ref 137–147)
Total Protein: 4.7 g/dL — ABNORMAL LOW (ref 6.0–8.3)

## 2013-12-25 LAB — PREPARE RBC (CROSSMATCH)

## 2013-12-25 LAB — URIC ACID: Uric Acid, Serum: 9.1 mg/dL — ABNORMAL HIGH (ref 2.4–7.0)

## 2013-12-25 LAB — LACTATE DEHYDROGENASE: LDH: 433 U/L — AB (ref 94–250)

## 2013-12-25 MED ORDER — DIPHENHYDRAMINE HCL 25 MG PO CAPS
25.0000 mg | ORAL_CAPSULE | Freq: Once | ORAL | Status: AC
  Administered 2013-12-25: 25 mg via ORAL

## 2013-12-25 MED ORDER — OXYCODONE-ACETAMINOPHEN 5-325 MG PO TABS: 1.0000 | ORAL_TABLET | ORAL | Status: DC | PRN

## 2013-12-25 MED ORDER — FERROUS SULFATE 325 (65 FE) MG PO TABS: 325.0000 mg | ORAL_TABLET | Freq: Two times a day (BID) | ORAL | Status: DC

## 2013-12-25 NOTE — Progress Notes (Signed)
Subjective: Postpartum Day 2: Cesarean Delivery due to di/di twins w/pre eclampsia Patient up ad lib, reports no syncope or dizziness. She c/o HA, blurry vision, URQ pain Feeding:  Breast pumping Contraceptive plan: unsure  Objective: Vital signs in last 24 hours: Temp:  [98 F (36.7 C)-98.5 F (36.9 C)] 98.2 F (36.8 C) (07/17 1343) Pulse Rate:  [60-87] 82 (07/17 1343) Resp:  [16-20] 16 (07/17 1343) BP: (126-145)/(67-88) 126/80 mmHg (07/17 1343) SpO2:  [93 %-100 %] 99 % (07/17 1343) Weight:  [167 lb 0.5 oz (75.765 kg)-169 lb (76.658 kg)] 167 lb 0.5 oz (75.765 kg) (07/17 0558)  Filed Vitals:   12/25/13 0147 12/25/13 0558 12/25/13 0941 12/25/13 1343  BP: 137/80 132/78 142/74 126/80  Pulse: 80 75 87 82  Temp: 98 F (36.7 C) 98.2 F (36.8 C) 98.2 F (36.8 C) 98.2 F (36.8 C)  TempSrc: Oral Oral Oral Oral  Resp: 20 18 18 16   Height:      Weight:  167 lb 0.5 oz (75.765 kg)    SpO2: 100% 98% 100% 99%   Physical Exam:  General: alert and cooperative Lochia: appropriate Uterine Fundus: firm Abdomen:  + bowel sounds, non distended Incision: no significant drainage, Pressure dressing CDI DVT Evaluation: No evidence of DVT seen on physical exam. Homan's sign: Negative +2 edema   Recent Labs  12/23/13 0605 12/23/13 1900 12/24/13 0538  HGB 13.1 10.1* 9.3*  HCT 38.0 29.5* 27.0*  WBC 13.9* 17.8* 19.9*    Assessment/Plan: Status post Cesarean section day 2 Doing well postoperatively.  Continue current care.  Labs today Remove outer dressing Anemia - hemodynamicly stable.  Plan for discharge tomorrow     Errika Narvaiz, CNM, MSN 12/25/2013. 2:23 PM

## 2013-12-25 NOTE — Progress Notes (Signed)
Called to pt room to eval.  Pt c/o HA, URQ pain, and vision changes.   Labs and orthostatic done, consult with Dr Estanislado Pandyivard Results for orders placed during the hospital encounter of 12/16/13 (from the past 24 hour(s))  CBC WITH DIFFERENTIAL     Status: Abnormal   Collection Time    12/25/13  2:48 PM      Result Value Ref Range   WBC 17.0 (*) 4.0 - 10.5 K/uL   RBC 2.24 (*) 3.87 - 5.11 MIL/uL   Hemoglobin 7.7 (*) 12.0 - 15.0 g/dL   HCT 16.122.3 (*) 09.636.0 - 04.546.0 %   MCV 99.6  78.0 - 100.0 fL   MCH 34.4 (*) 26.0 - 34.0 pg   MCHC 34.5  30.0 - 36.0 g/dL   RDW 40.915.7 (*) 81.111.5 - 91.415.5 %   Platelets 151  150 - 400 K/uL   Neutrophils Relative % 74  43 - 77 %   Lymphocytes Relative 19  12 - 46 %   Monocytes Relative 6  3 - 12 %   Eosinophils Relative 1  0 - 5 %   Basophils Relative 0  0 - 1 %   Neutro Abs 12.6 (*) 1.7 - 7.7 K/uL   Lymphs Abs 3.2  0.7 - 4.0 K/uL   Monocytes Absolute 1.0  0.1 - 1.0 K/uL   Eosinophils Absolute 0.2  0.0 - 0.7 K/uL   Basophils Absolute 0.0  0.0 - 0.1 K/uL  COMPREHENSIVE METABOLIC PANEL     Status: Abnormal   Collection Time    12/25/13  2:48 PM      Result Value Ref Range   Sodium 138  137 - 147 mEq/L   Potassium 4.0  3.7 - 5.3 mEq/L   Chloride 101  96 - 112 mEq/L   CO2 26  19 - 32 mEq/L   Glucose, Bld 107 (*) 70 - 99 mg/dL   BUN 13  6 - 23 mg/dL   Creatinine, Ser 7.820.86  0.50 - 1.10 mg/dL   Calcium 8.2 (*) 8.4 - 10.5 mg/dL   Total Protein 4.7 (*) 6.0 - 8.3 g/dL   Albumin 1.8 (*) 3.5 - 5.2 g/dL   AST 956149 (*) 0 - 37 U/L   ALT 157 (*) 0 - 35 U/L   Alkaline Phosphatase 119 (*) 39 - 117 U/L   Total Bilirubin <0.2 (*) 0.3 - 1.2 mg/dL   GFR calc non Af Amer 88 (*) >90 mL/min   GFR calc Af Amer >90  >90 mL/min   Anion gap 11  5 - 15  URIC ACID     Status: Abnormal   Collection Time    12/25/13  2:48 PM      Result Value Ref Range   Uric Acid, Serum 9.1 (*) 2.4 - 7.0 mg/dL  LACTATE DEHYDROGENASE     Status: Abnormal   Collection Time    12/25/13  2:48 PM   Result Value Ref Range   LDH 433 (*) 94 - 250 U/L   Filed Vitals:   12/25/13 0558 12/25/13 0941 12/25/13 1343 12/25/13 1700  BP: 132/78 142/74 126/80 139/81  Pulse: 75 87 82 94  Temp: 98.2 F (36.8 C) 98.2 F (36.8 C) 98.2 F (36.8 C) 98.5 F (36.9 C)  TempSrc: Oral Oral Oral Oral  Resp: 18 18 16 18   Height:      Weight: 167 lb 0.5 oz (75.765 kg)     SpO2: 98% 100% 99% 100%   Orthostatic  Lying       1631  122/63  91P Sitting     1634  131/75  100P Standing 1636 129/82   107  Plan Pain control ADB [place] Offer blood transfusion

## 2013-12-25 NOTE — Progress Notes (Signed)
Subjective: Postpartum Day 2: Cesarean Delivery secondary to: pre-eclampsia, severe features Patient reports incisional pain, tolerating PO and no problems voiding. Denies any upper abdominal pain or RUQ pain, headache improving,   Several family members supportive at bs, have been to see babies, both doing well in NICU Blood transfusion in progress  States had increased lochia w clots and "tissue" this morning, but has improved, wearing small pad, last changed at noon, scant lochia noted  Objective: Vital signs in last 24 hours: Temp:  [98 F (36.7 C)-98.5 F (36.9 C)] 98.3 F (36.8 C) (07/17 2057) Pulse Rate:  [72-94] 93 (07/17 2057) Resp:  [16-20] 16 (07/17 2057) BP: (126-142)/(74-83) 134/77 mmHg (07/17 2057) SpO2:  [97 %-100 %] 98 % (07/17 2057) Weight:  [167 lb 0.5 oz (75.765 kg)-169 lb (76.658 kg)] 167 lb 0.5 oz (75.765 kg) (07/17 0558)  Physical Exam:  General: alert and no distress Heart: RRR Lungs: CTAB Abdomen: BS x4 Uterine Fundus: firm, U-3   Lochia: appropriate DVT Evaluation: No evidence of DVT seen on physical exam. Negative Homan's sign. Calf/Ankle edema is present.   Recent Labs  12/24/13 0538 12/25/13 1448  HGB 9.3* 7.7*  HCT 27.0* 22.3*      [place] Pelvis Complete  12/25/2013   CLINICAL DATA:  Postpartum hemorrhage.  EXAM: TRANSABDOMINAL ULTRASOUND OF PELVIS  TECHNIQUE: Transabdominal ultrasound examination of the pelvis was performed including evaluation of the uterus, ovaries, adnexal regions, and pelvic cul-de-sac.  COMPARISON:  December 22, 2013.  FINDINGS: Uterus  Measurements: 13.2 x 10.8 x 8.4 cm. Enlarged postpartum uterus is noted.  Endometrium  Thickness: 5.4 cm. Endometrium is thickened and heterogeneous, and color flow demonstrates vascularity within the endometrium suggesting retained products of conception.  Right ovary  Not visualized.  Left ovary  Not visualized.  Other findings:  No free fluid is noted.  IMPRESSION: Enlarged postpartum uterus  is noted. Ovaries are not visualized. Thickened and heterogeneous endometrium is noted with color flow demonstrating vascularity within portions of the thickened endometrium, which is concerning for retained products of conception.   Electronically Signed   By: Roque LiasJames  Green M.D.   On: 12/25/2013 19:53     Assessment/Plan: C/W Dr Richardson Doppole   Status post Cesarean section. Postoperative course complicated by anemia, and increased LFT's  bl tx 2 units, in progress, will check CBC and CMET 4hrs after completion  Pt stable, w abdominal pain resolving, will not do abdominal US at this time   Continue current care.    Isobel Eisenhuth M 12/25/2013, 9:40 PM

## 2013-12-25 NOTE — Progress Notes (Signed)
Spoke to patient, offering a blood transfusion.  She and her husband want to talk about and will call with their decision  .

## 2013-12-25 NOTE — Progress Notes (Signed)
Pt and husband willing to accept a blood transfusion.   Plan 2 unit of PRBC

## 2013-12-25 NOTE — Lactation Note (Signed)
This note was copied from the chart of Ashley CockerBoyA Jaqueline Tellez. Lactation Consultation Note    Follow up consult with this mom of NICU twins, now 7348 hours old and 34 5/7 weeks CGA. Mom's areolas and nipples are much softer today, and no more peeling. i advised mom to use coconut or olive oil, which are anti fungal. On exam, I was able to hand express much easier today, and was able to collect about 0.5 mls in a minute. Momm thrilled. She is not feeling well today - headache and blurry vision. She has labs pending. If mom goes home tomorrow, she already has a home dEP.   Patient Name: Ashley Farley WNUUV'OToday's Date: 12/25/2013 Reason for consult: Follow-up assessment;NICU baby;Late preterm infant;Multiple gestation;Infant < 6lbs   Maternal Data    Feeding Feeding Type: Formula Nipple Type: Slow - flow Length of feed: 15 min  LATCH Score/Interventions                      Lactation Tools Discussed/Used     Consult Status Consult Status: PRN Follow-up type: In-patient (NICU)    Alfred LevinsLee, Vonnie Ligman Anne 12/25/2013, 3:26 PM

## 2013-12-25 NOTE — Progress Notes (Signed)
2255- IV site leaking, had to remove and start a new IV in right hand, blood switched over to new site, pt. OOB to clean left arm and do pericare. VS taken as soon as pt. returned from bathroom

## 2013-12-25 NOTE — Progress Notes (Signed)
Received patient from AICU via wheel chair. Transfer report received from K. Sachs,RN.  Significant other with patient.

## 2013-12-26 DIAGNOSIS — D649 Anemia, unspecified: Secondary | ICD-10-CM

## 2013-12-26 LAB — COMPREHENSIVE METABOLIC PANEL
ALT: 190 U/L — ABNORMAL HIGH (ref 0–35)
ALT: 185 U/L — AB (ref 0–35)
ANION GAP: 11 (ref 5–15)
AST: 159 U/L — ABNORMAL HIGH (ref 0–37)
AST: 143 U/L — ABNORMAL HIGH (ref 0–37)
Albumin: 1.9 g/dL — ABNORMAL LOW (ref 3.5–5.2)
Albumin: 2.1 g/dL — ABNORMAL LOW (ref 3.5–5.2)
Alkaline Phosphatase: 129 U/L — ABNORMAL HIGH (ref 39–117)
Alkaline Phosphatase: 138 U/L — ABNORMAL HIGH (ref 39–117)
Anion gap: 13 (ref 5–15)
BUN: 10 mg/dL (ref 6–23)
BUN: 11 mg/dL (ref 6–23)
CALCIUM: 9.3 mg/dL (ref 8.4–10.5)
CO2: 26 meq/L (ref 19–32)
CO2: 26 meq/L (ref 19–32)
CREATININE: 0.99 mg/dL (ref 0.50–1.10)
Calcium: 8.7 mg/dL (ref 8.4–10.5)
Chloride: 103 mEq/L (ref 96–112)
Chloride: 97 mEq/L (ref 96–112)
Creatinine, Ser: 0.72 mg/dL (ref 0.50–1.10)
GFR calc Af Amer: 86 mL/min — ABNORMAL LOW (ref >90)
GFR, EST NON AFRICAN AMERICAN: 74 mL/min — AB (ref >90)
GLUCOSE: 93 mg/dL (ref 70–99)
Glucose, Bld: 111 mg/dL — ABNORMAL HIGH (ref 70–99)
Potassium: 4.3 mEq/L (ref 3.7–5.3)
Potassium: 4.3 mEq/L (ref 3.7–5.3)
SODIUM: 136 meq/L — AB (ref 137–147)
Sodium: 140 mEq/L (ref 137–147)
TOTAL PROTEIN: 5.7 g/dL — AB (ref 6.0–8.3)
Total Bilirubin: 0.3 mg/dL (ref 0.3–1.2)
Total Bilirubin: 0.2 mg/dL — ABNORMAL LOW (ref 0.3–1.2)
Total Protein: 4.9 g/dL — ABNORMAL LOW (ref 6.0–8.3)

## 2013-12-26 LAB — CBC WITH DIFFERENTIAL/PLATELET
Basophils Absolute: 0 10*3/uL (ref 0.0–0.1)
Basophils Relative: 0 % (ref 0–1)
EOS PCT: 1 % (ref 0–5)
Eosinophils Absolute: 0.2 10*3/uL (ref 0.0–0.7)
HCT: 30.8 % — ABNORMAL LOW (ref 36.0–46.0)
Hemoglobin: 10.4 g/dL — ABNORMAL LOW (ref 12.0–15.0)
LYMPHS PCT: 24 % (ref 12–46)
Lymphs Abs: 3.9 10*3/uL (ref 0.7–4.0)
MCH: 31 pg (ref 26.0–34.0)
MCHC: 33.8 g/dL (ref 30.0–36.0)
MCV: 91.9 fL (ref 78.0–100.0)
MONOS PCT: 8 % (ref 3–12)
Monocytes Absolute: 1.3 10*3/uL — ABNORMAL HIGH (ref 0.1–1.0)
NEUTROS PCT: 67 % (ref 43–77)
Neutro Abs: 10.8 10*3/uL — ABNORMAL HIGH (ref 1.7–7.7)
PLATELETS: 124 10*3/uL — AB (ref 150–400)
RBC: 3.35 MIL/uL — AB (ref 3.87–5.11)
RDW: 19.5 % — ABNORMAL HIGH (ref 11.5–15.5)
WBC: 16.2 10*3/uL — ABNORMAL HIGH (ref 4.0–10.5)

## 2013-12-26 LAB — TYPE AND SCREEN
ABO/RH(D): B POS
ABO/RH(D): B POS
Antibody Screen: NEGATIVE
Antibody Screen: NEGATIVE
Unit division: 0
Unit division: 0

## 2013-12-26 LAB — CBC
HEMATOCRIT: 31 % — AB (ref 36.0–46.0)
HEMOGLOBIN: 10.6 g/dL — AB (ref 12.0–15.0)
MCH: 31.7 pg (ref 26.0–34.0)
MCHC: 34.2 g/dL (ref 30.0–36.0)
MCV: 92.8 fL (ref 78.0–100.0)
Platelets: 151 10*3/uL (ref 150–400)
RBC: 3.34 MIL/uL — ABNORMAL LOW (ref 3.87–5.11)
RDW: 20.3 % — ABNORMAL HIGH (ref 11.5–15.5)
WBC: 15.9 10*3/uL — AB (ref 4.0–10.5)

## 2013-12-26 NOTE — Progress Notes (Signed)
Subjective: Postpartum Day 3 Just before discharge RN reports that pt still complains of continued blurry vision.   Pt states vision is burry when she tries to concentrate on something or someone, and reports occasional "little flashes."  HA has improved.  Pt reports abdominal pain of 3-4/10 while resting and 7/10 with movement - took her first dose of Percocet at 1400 today.   Pt reports that she voids "large" amounts about every 2 hours, + flatus, and BM yesterday.  Objective: Vital signs in last 24 hours: Temp:  [97.3 F (36.3 C)-98.9 F (37.2 C)] 97.3 F (36.3 C) (07/18 1517) Pulse Rate:  [74-104] 84 (07/18 1517) Resp:  [16-20] 20 (07/18 1517) BP: (126-142)/(74-90) 142/90 mmHg (07/18 1517) SpO2:  [96 %-100 %] 99 % (07/18 1517) Weight:  [165 lb 11.2 oz (75.161 kg)] 165 lb 11.2 oz (75.161 kg) (07/18 0543)  Filed Vitals:   12/26/13 0604 12/26/13 0735 12/26/13 1145 12/26/13 1517  BP: 134/76  137/77 142/90  Pulse: 74  82 84  Temp: 97.9 F (36.6 C)  98.5 F (36.9 C) 97.3 F (36.3 C)  TempSrc: Oral  Oral Oral  Resp: 16 20 18 20   Height:      Weight:      SpO2: 99%  100% 99%    Physical Exam:  General: alert, cooperative, mild distress and tearful d/t worry that she would have to go back to the OR for further surgery (this may also account for her BP at 1517) Lochia: scant Uterine Fundus: Firm at U, some tenderness with palpation Abdomen: round without distention, reports pain on LLQ with palpation Incision: healing well, no drainage noted on honeycomb dressing DTRs WNL, +3 pitting edema DVT Evaluation: Calf/Ankle edema is present. JP drain:   n/a   Recent Labs  12/25/13 1448 12/26/13 0630 12/26/13 1450  HGB 7.7* 10.4* 10.6*  HCT 22.3* 30.8* 31.0*  WBC 17.0* 16.2* 15.9*    Assessment/Plan: Status post Cesarean section day 3 Status post blood transfusion Blurry vision Abdominal pain  C/w Dr. Richardson Doppole CBC and CMET ordered Dr. Richardson Doppole will review labs and vitals and see  the patient   Haroldine LawsOXLEY, Marijo Quizon CNM, MSN 12/26/2013, 3:29 PM

## 2013-12-26 NOTE — Progress Notes (Signed)
Subjective: Postpartum Day 3: Cesarean Delivery Twins and Preeclampsia with Severe features. / Anemia s/p 2 units pRBC 12/25/2013 Patient reports incisional pain, tolerating PO, + flatus and no problems voiding.   In to assess patient due to  Reports of blurred vision and headache. Pt states that she only gets a headache when she is trying to read. Her vision is blurry and when she tries to read she gets a headache. Once she stops reading it resolves. She is tolerating po . Bleeding is minimal . She is changing her pad once a day. She denies ruq pain.    Objective: Vital signs in last 24 hours: Temp:  [97.3 F (36.3 C)-98.9 F (37.2 C)] 97.3 F (36.3 C) (07/18 1517) Pulse Rate:  [74-104] 84 (07/18 1517) Resp:  [16-20] 20 (07/18 1517) BP: (126-142)/(74-90) 142/90 mmHg (07/18 1517) SpO2:  [96 %-100 %] 99 % (07/18 1517) Weight:  [75.161 kg (165 lb 11.2 oz)] 75.161 kg (165 lb 11.2 oz) (07/18 0543)  Physical Exam:  General: alert and cooperative Lochia: appropriate  Lungs Clear with r/r/w Heart : RRR Uterine Fundus: firm Incision: healing well DVT Evaluation:  Pedal edema 2+ ...  Reflexes . 2+ bilaterally no  Clonus    Recent Labs  12/26/13 0630 12/26/13 1450  HGB 10.4* 10.6*  HCT 30.8* 31.0*    Assessment and Plan  POD #3 s/p cesarean section / twins with preeclampsia with Severe Features. Improving  BP is stable  Anemia improved and stable after 2 units pRBC Elevated LFT's  .Marland Kitchen. Last result stable   Blurred vision.. Should improve however she is advised that she may need to see an optometrist if it does not improved.  Stable for discharge home: Follow up with [facility] this week   Tamey Wanek J. 12/26/2013, 4:14 PM

## 2013-12-26 NOTE — Discharge Summary (Signed)
Cesarean Section Delivery Discharge Summary  Ashley Farley  DOB:    1981/03/28 MRN:    [id number] CSN:    [id number]  Date of admission:                  12/16/2013  Date of discharge:                   12/26/2013  Procedures this admission: Cesarean Section, blood tranafusion  Date of Delivery: 12/22/2013  Newborn Data:     Ashley, Farley [[id number]]  Live born female  Birth Weight: 4 lb 7.6 oz (2030 g) APGAR: 9, 9   Ashley, Farley [[id number]]  Live born female  Birth Weight: 4 lb 13.3 oz (2190 g) APGAR: 8, 8  Circumcision Plan: NO  History of Present Illness:  [name] is a 33 y.o. female, [phone], who presents at [redacted]w[redacted]d weeks gestation. The patient has been followed at the [facility] and Gynecology division of [facility].    Her pregnancy has been complicated by:  Patient Active Problem List   Diagnosis Date Noted  . Anemia 12/26/2013  . Pre-eclampsia, severe, delivered 12/23/2013  . Preeclampsia 12/16/2013  . Twins--di/di 11/26/2013  . Short cervix--1.36 on [place] today 11/26/2013  . Kidney stones--hx 11/26/2013  . H/O infertility--IVF pregnancy 11/26/2013   .  Hospital course:  The patient was admitted for mild pre-eclampsia with di/di twins.   Her postpartum course was complicated by symptomatic anemia.  She was discharged to home on postpartum day 3 doing well.  Feeding:  Breast pumping  Contraception:  unsure  Discharge hemoglobin:  HGB  Date Value Ref Range Status  03/30/2009 13.5  11.6 - 15.9 g/dL Final     Hemoglobin  Date Value Ref Range Status  12/26/2013 10.4* 12.0 - 15.0 g/dL Final     DELTA CHECK NOTED     REPEATED TO VERIFY     POST TRANSFUSION SPECIMEN     HCT  Date Value Ref Range Status  12/26/2013 30.8* 36.0 - 46.0 % Final  03/30/2009 40.1  34.8 - 46.6 % Final    Discharge vitals Filed Vitals:   12/26/13 0300 12/26/13 0543 12/26/13 0604 12/26/13 0735  BP: 128/80  134/76   Pulse: 86   74   Temp: 98.6 F (37 C)  97.9 F (36.6 C)   TempSrc: Oral  Oral   Resp: 18  16 20   Height:      Weight:  165 lb 11.2 oz (75.161 kg)    SpO2: 97%  99%     Discharge Physical Exam:   General: alert and cooperative, denies any significant HA, URQ pain or vision changes Lochia: appropriate Uterine Fundus: firm Incision: no significant drainage DVT Evaluation: No evidence of DVT seen on physical exam.  Intrapartum Procedures: cesarean: low cervical, transverse Postpartum Procedures: transfusion 2 units PRBC Complications-Operative and Postpartum: none  Discharge Diagnoses: Preelampsia and symptmatic anemia, CS  Discharge Information:  Activity:           pelvic rest Diet:                routine Medications: PNV, Iron and Percocet Condition:      stable Instructions:  Return to office in 1 week for BP check   Discharge to: home  Follow-up Information   Follow up with [facility]. Schedule an appointment as soon as possible for a visit in 1 week. (for a  blood pressure check)    Specialty:  Obstetrics and Gynecology   Contact information:   [address] [place] [id number] [phone]       Adelina MingsVenus Christyan Reger, CNM, MSN  12/26/2013. 9:02 AM  Care After Cesarean Delivery  Refer to this sheet in the next few weeks. These instructions provide you with information on caring for yourself after your procedure. Your caregiver may also give you specific instructions. Your treatment has been planned according to current medical practices, but problems sometimes occur. Call your caregiver if you have any problems or questions after you go home. HOME CARE INSTRUCTIONS  Only take over-the-counter or prescription medicines as directed by your caregiver.  Do not drink alcohol, especially if you are breastfeeding or taking medicine to relieve pain.  Do not chew or smoke tobacco.  Continue to use good perineal care. Good perineal  care includes:  Wiping your perineum from front to back.  Keeping your perineum clean.  Check your cut (incision) daily for increased redness, drainage, swelling, or separation of skin.  Clean your incision gently with soap and water every day, and then pat it dry. If your caregiver says it is okay, leave the incision uncovered. Use a bandage (dressing) if the incision is draining fluid or appears irritated. If the adhesive strips across the incision do not fall off within 7 days, carefully peel them off.  Hug a pillow when coughing or sneezing until your incision is healed. This helps to relieve pain.  Do not use tampons or douche until your caregiver says it is okay.  Shower, wash your hair, and take tub baths as directed by your caregiver.  Wear a well-fitting bra that provides breast support.  Limit wearing support panties or control-top hose.  Drink enough fluids to keep your urine clear or pale yellow.  Eat high-fiber foods such as whole grain cereals and breads, brown rice, beans, and fresh fruits and vegetables every day. These foods may help prevent or relieve constipation.  Resume activities such as climbing stairs, driving, lifting, exercising, or traveling as directed by your caregiver.  Talk to your caregiver about resuming sexual activities. This is dependent upon your risk of infection, your rate of healing, and your comfort and desire to resume sexual activity.  Try to have someone help you with your household activities and your newborn for at least a few days after you leave the hospital.  Rest as much as possible. Try to rest or take a nap when your newborn is sleeping.  Increase your activities gradually.  Keep all of your scheduled postpartum appointments. It is very important to keep your scheduled follow-up appointments. At these appointments, your caregiver will be checking to make sure that you are healing physically and emotionally. SEEK MEDICAL CARE IF:    You are passing large clots from your vagina. Save any clots to show your caregiver.  You have a foul smelling discharge from your vagina.  You have trouble urinating.  You are urinating frequently.  You have pain when you urinate.  You have a change in your bowel movements.  You have increasing redness, pain, or swelling near your incision.  You have pus draining from your incision.  Your incision is separating.  You have painful, hard, or reddened breasts.  You have a severe headache.  You have blurred vision or see spots.  You feel sad or depressed.  You have thoughts of hurting yourself or your newborn.  You have questions about  your care, the care of your newborn, or medicines.  You are dizzy or lightheaded.  You have a rash.  You have pain, redness, or swelling at the site of the removed intravenous access (IV) tube.  You have nausea or vomiting.  You stopped breastfeeding and have not had a menstrual period within 12 weeks of stopping.  You are not breastfeeding and have not had a menstrual period within 12 weeks of delivery.  You have a fever. SEEK IMMEDIATE MEDICAL CARE IF:  You have persistent pain.  You have chest pain.  You have shortness of breath.  You faint.  You have leg pain.  You have stomach pain.  Your vaginal bleeding saturates 2 or more sanitary pads in 1 hour. MAKE SURE YOU:   Understand these instructions.  Will watch your condition.  Will get help right away if you are not doing well or get worse. Document Released: 02/17/2002 Document Revised: 02/20/2012 Document Reviewed: 01/23/2012 [facility] Patient Information 2014 [place], [place].   Postpartum Depression and Baby Blues  The postpartum period begins right after the birth of a baby. During this time, there is often a great amount of joy and excitement. It is also a time of considerable changes in the life of the parent(s). Regardless of how many times a mother gives  birth, each child brings new challenges and dynamics to the family. It is not unusual to have feelings of excitement accompanied by confusing shifts in moods, emotions, and thoughts. All mothers are at risk of developing postpartum depression or the "baby blues." These mood changes can occur right after giving birth, or they may occur many months after giving birth. The baby blues or postpartum depression can be mild or severe. Additionally, postpartum depression can resolve rather quickly, or it can be a long-term condition. CAUSES Elevated hormones and their rapid decline are thought to be a main cause of postpartum depression and the baby blues. There are a number of hormones that radically change during and after pregnancy. Estrogen and progesterone usually decrease immediately after delivering your baby. The level of thyroid hormone and various cortisol steroids also rapidly drop. Other factors that play a major role in these changes include major life events and genetics.  RISK FACTORS If you have any of the following risks for the baby blues or postpartum depression, know what symptoms to watch out for during the postpartum period. Risk factors that may increase the likelihood of getting the baby blues or postpartum depression include:  Havinga personal or family history of depression.  Having depression while being pregnant.  Having premenstrual or oral contraceptive-associated mood issues.  Having exceptional life stress.  Having marital conflict.  Lacking a social support network.  Having a baby with special needs.  Having health problems such as diabetes. SYMPTOMS Baby blues symptoms include:  Brief fluctuations in mood, such as going from extreme happiness to sadness.  Decreased concentration.  Difficulty sleeping.  Crying spells, tearfulness.  Irritability.  Anxiety. Postpartum depression symptoms typically begin within the first month after giving birth. These  symptoms include:  Difficulty sleeping or excessive sleepiness.  Marked weight loss.  Agitation.  Feelings of worthlessness.  Lack of interest in activity or food. Postpartum psychosis is a very concerning condition and can be dangerous. Fortunately, it is rare. Displaying any of the following symptoms is cause for immediate medical attention. Postpartum psychosis symptoms include:  Hallucinations and delusions.  Bizarre or disorganized behavior.  Confusion or disorientation. DIAGNOSIS  A diagnosis is  made by an evaluation of your symptoms. There are no medical or lab tests that lead to a diagnosis, but there are various questionnaires that a caregiver may use to identify those with the baby blues, postpartum depression, or psychosis. Often times, a screening tool called the [place] Postnatal Depression Scale is used to diagnose depression in the postpartum period.  TREATMENT The baby blues usually goes away on its own in 1 to 2 weeks. Social support is often all that is needed. You should be encouraged to get adequate sleep and rest. Occasionally, you may be given medicines to help you sleep.  Postpartum depression requires treatment as it can last several months or longer if it is not treated. Treatment may include individual or group therapy, medicine, or both to address any social, physiological, and psychological factors that may play a role in the depression. Regular exercise, a healthy diet, rest, and social support may also be strongly recommended.  Postpartum psychosis is more serious and needs treatment right away. Hospitalization is often needed. HOME CARE INSTRUCTIONS  Get as much rest as you can. Nap when the baby sleeps.  Exercise regularly. Some women find yoga and walking to be beneficial.  Eat a balanced and nourishing diet.  Do little things that you enjoy. Have a cup of tea, take a bubble bath, read your favorite magazine, or listen to your favorite  music.  Avoid alcohol.  Ask for help with household chores, cooking, grocery shopping, or running errands as needed. Do not try to do everything.  Talk to people close to you about how you are feeling. Get support from your partner, family members, friends, or other new moms.  Try to stay positive in how you think. Think about the things you are grateful for.  Do not spend a lot of time alone.  Only take medicine as directed by your caregiver.  Keep all your postpartum appointments.  Let your caregiver know if you have any concerns. SEEK MEDICAL CARE IF: You are having a reaction or problems with your medicine. SEEK IMMEDIATE MEDICAL CARE IF:  You have suicidal feelings.  You feel you may harm the baby or someone else. Document Released: 03/01/2004 Document Revised: 08/20/2011 Document Reviewed: 04/03/2011 [facility] Patient Information 2014 [place], [place].

## 2013-12-26 NOTE — Progress Notes (Signed)
This note also relates to the following rows which could not be included: Rate - Cannot attach notes to extension rows Line - Cannot attach notes to extension rows   PRBC actually started at 0007

## 2013-12-26 NOTE — Discharge Instructions (Signed)
Breastfeeding After Breast Surgery Breast surgery may affect your ability to breastfeed. The type of surgery and where the surgical cuts (incisions) were made mayinfluence your milk supply. If your milk ducts and major nerves were not cut, your milk supply may not be affected. If your incisions were made in the fold underneath your breasts or near your armpits, you are less likely to have problems producing milk. If the incisions were made around or across the darker area that surrounds the nipple (areola), you are more likely to have difficulties. This is because some milk ducts were cut, and there may be some nerve damage. A loss of sensation in one or both nipples is a sign of nerve damage. This will affect the let-down reflex and your milk supply. WHAT ARE THE TYPES OF BREAST SURGERY? Your ability to breastfeed after surgery depends on the type of surgery. Talk with a lactation specialist about breastfeeding if you have had breast surgery. This includes breast reduction, breast augmentation, and breast cancer surgery. If you are considering breast surgery, and you may become pregnant in the future, talk to your surgeon about how the surgery may affect breastfeeding. Here is what you need to know about some common breast surgeries and breastfeeding:  Breast reduction. A breast reduction is surgery to decrease the size of your breasts. If you have had this surgery, it is likely to affect your ability to breastfeed. Sections of the breast, including milk ducts, are often removed during the procedure. In addition, major nerves are usually cut. If your nipple was removed during the surgery and then reattached, all milk ducts and major nerves were cut. This reduces the chance that breastfeeding will be successful.  Breast augmentation. A breast augmentation is surgery to increase the size of your breasts. This surgery may involve placing breast implants into your breasts. Implants are placed underneath your  breast. In most cases, the implants will not affect breastfeeding. If your breast augmentation includes incisions near your nipples, the ducts that carry milk to your nipples may be damaged. Most women can breastfeed after breast augmentation.  Breast cancer surgery. Breast cancer surgery involves removing breast tissue. It often does limit your ability to breastfeed. If you have surgery to remove only the cancer and a small amount of breast tissue (lumpectomy), you may be able to breastfeed. If you have X-ray treatments along with breast surgery, or if you have your breast removed entirely (mastectomy), you will not be able to breastfeed. If you have breast cancer surgery in one breast, you may still be able to breastfeed from your other breast. WHAT ARE GUIDELINES TO FOLLOW AFTER BREAST SURGERY? If you have not yet had your surgery, talk to a lactation specialist and your surgeon about whether you will be able to breastfeed after the surgery. Let them know your concerns. Your surgeon can try to preserve as much breast tissue and nerve function as possible. If you want to try breastfeeding after surgery, doing the following may help:  Breastfeed your newborn often.  Feed your baby whenever you notice hunger cues until he or she falls asleep or releases the breast.  Count the number of your baby's wet diapers and bowel movements. Signs that your baby is getting enough milk:  Wetting at least 3 diapers in a 24-hour period. The urine should be clear or pale yellow by age 6 days.  At least 3 stools in a 24-hour period by age 6 days. The stool should be soft and yellow.  At least 3 stools in a 24-hour period by age 30 days. The stool should be seedy and yellow.  No loss of weight greater than 10% of birth weight during the first 12 days of age.  Average weight gain of 4-7 ounces (113-198 g) per week after age 72 days.  Consistent daily weight gain by age 2 days, without weight loss after the age of 2  weeks.  By day 6, expect your baby to have 6-8 wet diapers and 3 stools per day.   Check your baby's weight at 1 week of age to make sure he or she is growing and gaining weight.   If your baby does not get enough milk from breastfeeding alone, it may be possible to breastfeed part of the time. You can supplement feedings with donated milk or formula. Discuss your options with a lactation specialist. WHEN SHOULD YOU SEEK MEDICAL CARE?   If you have a hard, sore area in your breast.   If you have flu-like symptoms.  Document Released: 05/28/2005 Document Revised: 06/02/2013 Document Reviewed: 03/20/2013 [facility] Patient Information 2015 [place], [place]. This information is not intended to replace advice given to you by your health care provider. Make sure you discuss any questions you have with your health care provider. Breastfeeding Deciding to breastfeed is one of the best choices you can make for you and your baby. A change in hormones during pregnancy causes your breast tissue to grow and increases the number and size of your milk ducts. These hormones also allow proteins, sugars, and fats from your blood supply to make breast milk in your milk-producing glands. Hormones prevent breast milk from being released before your baby is born as well as prompt milk flow after birth. Once breastfeeding has begun, thoughts of your baby, as well as his or her sucking or crying, can stimulate the release of milk from your milk-producing glands.  BENEFITS OF BREASTFEEDING For Your Baby  Your first milk (colostrum) helps your baby's digestive system function better.   There are antibodies in your milk that help your baby fight off infections.   Your baby has a lower incidence of asthma, allergies, and sudden infant death syndrome.   The nutrients in breast milk are better for your baby than infant formulas and are designed uniquely for your baby's needs.   Breast milk improves your baby's  brain development.   Your baby is less likely to develop other conditions, such as childhood obesity, asthma, or type 2 diabetes mellitus.  For You   Breastfeeding helps to create a very special bond between you and your baby.   Breastfeeding is convenient. Breast milk is always available at the correct temperature and costs nothing.   Breastfeeding helps to burn calories and helps you lose the weight gained during pregnancy.   Breastfeeding makes your uterus contract to its prepregnancy size faster and slows bleeding (lochia) after you give birth.   Breastfeeding helps to lower your risk of developing type 2 diabetes mellitus, osteoporosis, and breast or ovarian cancer later in life. SIGNS THAT YOUR BABY IS HUNGRY Early Signs of Hunger  Increased alertness or activity.  Stretching.  Movement of the head from side to side.  Movement of the head and opening of the mouth when the corner of the mouth or cheek is stroked (rooting).  Increased sucking sounds, smacking lips, cooing, sighing, or squeaking.  Hand-to-mouth movements.  Increased sucking of fingers or hands. Late Signs of Hunger  Fussing.  Intermittent crying.  Extreme Signs of Hunger Signs of extreme hunger will require calming and consoling before your baby will be able to breastfeed successfully. Do not wait for the following signs of extreme hunger to occur before you initiate breastfeeding:   Restlessness.  A loud, strong cry.   Screaming. BREASTFEEDING BASICS Breastfeeding Initiation  Find a comfortable place to sit or lie down, with your neck and back well supported.  Place a pillow or rolled up blanket under your baby to bring him or her to the level of your breast (if you are seated). Nursing pillows are specially designed to help support your arms and your baby while you breastfeed.  Make sure that your baby's abdomen is facing your abdomen.   Gently massage your breast. With your  fingertips, massage from your chest wall toward your nipple in a circular motion. This encourages milk flow. You may need to continue this action during the feeding if your milk flows slowly.  Support your breast with 4 fingers underneath and your thumb above your nipple. Make sure your fingers are well away from your nipple and your baby's mouth.   Stroke your baby's lips gently with your finger or nipple.   When your baby's mouth is open wide enough, quickly bring your baby to your breast, placing your entire nipple and as much of the colored area around your nipple (areola) as possible into your baby's mouth.   More areola should be visible above your baby's upper lip than below the lower lip.   Your baby's tongue should be between his or her lower gum and your breast.   Ensure that your baby's mouth is correctly positioned around your nipple (latched). Your baby's lips should create a seal on your breast and be turned out (everted).  It is common for your baby to suck about 2-3 minutes in order to start the flow of breast milk. Latching Teaching your baby how to latch on to your breast properly is very important. An improper latch can cause nipple pain and decreased milk supply for you and poor weight gain in your baby. Also, if your baby is not latched onto your nipple properly, he or she may swallow some air during feeding. This can make your baby fussy. Burping your baby when you switch breasts during the feeding can help to get rid of the air. However, teaching your baby to latch on properly is still the best way to prevent fussiness from swallowing air while breastfeeding. Signs that your baby has successfully latched on to your nipple:    Silent tugging or silent sucking, without causing you pain.   Swallowing heard between every 3-4 sucks.    Muscle movement above and in front of his or her ears while sucking.  Signs that your baby has not successfully latched on to  nipple:   Sucking sounds or smacking sounds from your baby while breastfeeding.  Nipple pain. If you think your baby has not latched on correctly, slip your finger into the corner of your baby's mouth to break the suction and place it between your baby's gums. Attempt breastfeeding initiation again. Signs of Successful Breastfeeding Signs from your baby:   A gradual decrease in the number of sucks or complete cessation of sucking.   Falling asleep.   Relaxation of his or her body.   Retention of a small amount of milk in his or her mouth.   Letting go of your breast by himself or herself. Signs from you:  Breasts  that have increased in firmness, weight, and size 1-3 hours after feeding.   Breasts that are softer immediately after breastfeeding.  Increased milk volume, as well as a change in milk consistency and color by the fifth day of breastfeeding.   Nipples that are not sore, cracked, or bleeding. Signs That Your Pecola LeisureBaby is Getting Enough Milk  Wetting at least 3 diapers in a 24-hour period. The urine should be clear and pale yellow by age 147 days.  At least 3 stools in a 24-hour period by age 147 days. The stool should be soft and yellow.  At least 3 stools in a 24-hour period by age 1 days. The stool should be seedy and yellow.  No loss of weight greater than 10% of birth weight during the first 783 days of age.  Average weight gain of 4-7 ounces (113-198 g) per week after age 14 days.  Consistent daily weight gain by age 147 days, without weight loss after the age of 2 weeks. After a feeding, your baby may spit up a small amount. This is common. BREASTFEEDING FREQUENCY AND DURATION Frequent feeding will help you make more milk and can prevent sore nipples and breast engorgement. Breastfeed when you feel the need to reduce the fullness of your breasts or when your baby shows signs of hunger. This is called "breastfeeding on demand." Avoid introducing a pacifier to your  baby while you are working to establish breastfeeding (the first 4-6 weeks after your baby is born). After this time you may choose to use a pacifier. Research has shown that pacifier use during the first year of a baby's life decreases the risk of sudden infant death syndrome (SIDS). Allow your baby to feed on each breast as long as he or she wants. Breastfeed until your baby is finished feeding. When your baby unlatches or falls asleep while feeding from the first breast, offer the second breast. Because newborns are often sleepy in the first few weeks of life, you may need to awaken your baby to get him or her to feed. Breastfeeding times will vary from baby to baby. However, the following rules can serve as a guide to help you ensure that your baby is properly fed:  Newborns (babies 584 weeks of age or younger) may breastfeed every 1-3 hours.  Newborns should not go longer than 3 hours during the day or 5 hours during the night without breastfeeding.  You should breastfeed your baby a minimum of 8 times in a 24-hour period until you begin to introduce solid foods to your baby at around 296 months of age. BREAST MILK PUMPING Pumping and storing breast milk allows you to ensure that your baby is exclusively fed your breast milk, even at times when you are unable to breastfeed. This is especially important if you are going back to work while you are still breastfeeding or when you are not able to be present during feedings. Your lactation consultant can give you guidelines on how long it is safe to store breast milk.  A breast pump is a machine that allows you to pump milk from your breast into a sterile bottle. The pumped breast milk can then be stored in a refrigerator or freezer. Some breast pumps are operated by hand, while others use electricity. Ask your lactation consultant which type will work best for you. Breast pumps can be purchased, but some hospitals and breastfeeding support groups lease  breast pumps on a monthly basis. A lactation consultant can  teach you how to hand express breast milk, if you prefer not to use a pump.  CARING FOR YOUR BREASTS WHILE YOU BREASTFEED Nipples can become dry, cracked, and sore while breastfeeding. The following recommendations can help keep your breasts moisturized and healthy:  Avoid using soap on your nipples.   Wear a supportive bra. Although not required, special nursing bras and tank tops are designed to allow access to your breasts for breastfeeding without taking off your entire bra or top. Avoid wearing underwire-style bras or extremely tight bras.  Air dry your nipples for 3-57minutes after each feeding.   Use only cotton bra pads to absorb leaked breast milk. Leaking of breast milk between feedings is normal.   Use lanolin on your nipples after breastfeeding. Lanolin helps to maintain your skin's normal moisture barrier. If you use pure lanolin, you do not need to wash it off before feeding your baby again. Pure lanolin is not toxic to your baby. You may also hand express a few drops of breast milk and gently massage that milk into your nipples and allow the milk to air dry. In the first few weeks after giving birth, some women experience extremely full breasts (engorgement). Engorgement can make your breasts feel heavy, warm, and tender to the touch. Engorgement peaks within 3-5 days after you give birth. The following recommendations can help ease engorgement:  Completely empty your breasts while breastfeeding or pumping. You may want to start by applying warm, moist heat (in the shower or with warm water-soaked hand towels) just before feeding or pumping. This increases circulation and helps the milk flow. If your baby does not completely empty your breasts while breastfeeding, pump any extra milk after he or she is finished.  Wear a snug bra (nursing or regular) or tank top for 1-2 days to signal your body to slightly decrease milk  production.  Apply ice packs to your breasts, unless this is too uncomfortable for you.  Make sure that your baby is latched on and positioned properly while breastfeeding. If engorgement persists after 48 hours of following these recommendations, contact your health care provider or a Advertising copywriter. OVERALL HEALTH CARE RECOMMENDATIONS WHILE BREASTFEEDING  Eat healthy foods. Alternate between meals and snacks, eating 3 of each per day. Because what you eat affects your breast milk, some of the foods may make your baby more irritable than usual. Avoid eating these foods if you are sure that they are negatively affecting your baby.  Drink milk, fruit juice, and water to satisfy your thirst (about 10 glasses a day).   Rest often, relax, and continue to take your prenatal vitamins to prevent fatigue, stress, and anemia.  Continue breast self-awareness checks.  Avoid chewing and smoking tobacco.  Avoid alcohol and drug use. Some medicines that may be harmful to your baby can pass through breast milk. It is important to ask your health care provider before taking any medicine, including all over-the-counter and prescription medicine as well as vitamin and herbal supplements. It is possible to become pregnant while breastfeeding. If birth control is desired, ask your health care provider about options that will be safe for your baby. SEEK MEDICAL CARE IF:   You feel like you want to stop breastfeeding or have become frustrated with breastfeeding.  You have painful breasts or nipples.  Your nipples are cracked or bleeding.  Your breasts are red, tender, or warm.  You have a swollen area on either breast.  You have a fever or  chills.  You have nausea or vomiting.  You have drainage other than breast milk from your nipples.  Your breasts do not become full before feedings by the fifth day after you give birth.  You feel sad and depressed.  Your baby is too sleepy to eat  well.  Your baby is having trouble sleeping.   Your baby is wetting less than 3 diapers in a 24-hour period.  Your baby has less than 3 stools in a 24-hour period.  Your baby's skin or the white part of his or her eyes becomes yellow.   Your baby is not gaining weight by 14 days of age. SEEK IMMEDIATE MEDICAL CARE IF:   Your baby is overly tired (lethargic) and does not want to wake up and feed.  Your baby develops an unexplained fever. Document Released: 05/28/2005 Document Revised: 06/02/2013 Document Reviewed: 11/19/2012 [facility] Patient Information 2015 [place], [place]. This information is not intended to replace advice given to you by your health care provider. Make sure you discuss any questions you have with your health care provider. Anemia, Nonspecific Anemia is a condition in which the concentration of red blood cells or hemoglobin in the blood is below normal. Hemoglobin is a substance in red blood cells that carries oxygen to the tissues of the body. Anemia results in not enough oxygen reaching these tissues.  CAUSES  Common causes of anemia include:   Excessive bleeding. Bleeding may be internal or external. This includes excessive bleeding from periods (in women) or from the intestine.   Poor nutrition.   Chronic kidney, thyroid, and liver disease.  Bone marrow disorders that decrease red blood cell production.  Cancer and treatments for cancer.  HIV, AIDS, and their treatments.  Spleen problems that increase red blood cell destruction.  Blood disorders.  Excess destruction of red blood cells due to infection, medicines, and autoimmune disorders. SIGNS AND SYMPTOMS   Minor weakness.   Dizziness.   Headache.  Palpitations.   Shortness of breath, especially with exercise.   Paleness.  Cold sensitivity.  Indigestion.  Nausea.  Difficulty sleeping.  Difficulty concentrating. Symptoms may occur suddenly or they may develop slowly.   DIAGNOSIS  Additional blood tests are often needed. These help your health care provider determine the best treatment. Your health care provider will check your stool for blood and look for other causes of blood loss.  TREATMENT  Treatment varies depending on the cause of the anemia. Treatment can include:   Supplements of iron, vitamin B12, or folic acid.   Hormone medicines.   A blood transfusion. This may be needed if blood loss is severe.   Hospitalization. This may be needed if there is significant continual blood loss.   Dietary changes.  Spleen removal. HOME CARE INSTRUCTIONS Keep all follow-up appointments. It often takes many weeks to correct anemia, and having your health care provider check on your condition and your response to treatment is very important. SEEK IMMEDIATE MEDICAL CARE IF:   You develop extreme weakness, shortness of breath, or chest pain.   You become dizzy or have trouble concentrating.  You develop heavy vaginal bleeding.   You develop a rash.   You have bloody or black, tarry stools.   You faint.   You vomit up blood.   You vomit repeatedly.   You have abdominal pain.  You have a fever or persistent symptoms for more than 2-3 days.   You have a fever and your symptoms suddenly get worse.  You are dehydrated.  MAKE SURE YOU:  Understand these instructions.  Will watch your condition.  Will get help right away if you are not doing well or get worse. Document Released: 07/05/2004 Document Revised: 01/28/2013 Document Reviewed: 11/21/2012 [facility] Patient Information 2015 [place], [place]. This information is not intended to replace advice given to you by your health care provider. Make sure you discuss any questions you have with your health care provider. Cesarean Delivery  Cesarean delivery is the birth of a baby through a cut (incision) in the abdomen and womb (uterus).  LET [facility] CARE PROVIDER KNOW  ABOUT:  All medicines you are taking, including vitamins, herbs, eye drops, creams, and over-the-counter medicines.  Previous problems you or members of your family have had with the use of anesthetics.  Any blood disorders you have.  Previous surgeries you have had.  Medical conditions you have.  Any allergies you have.  Complicationsinvolving the pregnancy. RISKS AND COMPLICATIONS  Generally, this is a safe procedure. However, as with any procedure, complications can occur. Possible complications include:  Bleeding.  Infection.  Blood clots.  Injury to surrounding organs.  Problems with anesthesia.  Injury to the baby. BEFORE THE PROCEDURE   You may be given an antacid medicine to drink. This will prevent acid contents in your stomach from going into your lungs if you vomit during the surgery.  You may be given an antibiotic medicine to prevent infection. PROCEDURE   Hair may be removed from your pubic area and your lower abdomen. This is to prevent infection in the incision site.  A tube (Foley catheter) will be placed in your bladder to drain your urine from your bladder into a bag. This keeps your bladder empty during surgery.  An IV tube will be placed in your vein.  You may be given medicine to numb the lower half of your body (regional anesthetic). If you were in labor, you may have already had an epidural in place which can be used in both labor and cesarean delivery. You may possibly be given medicine to make you sleep (general anesthetic) though this is not as common.  An incision will be made in your abdomen that extends to your uterus. There are 2 basic kinds of incisions:  The horizontal (transverse) incision. Horizontal incisions are from side to side and are used for most routine cesarean deliveries.  The vertical incision. The vertical incision is from the top of the abdomen to the bottom and is less commonly used. It is often done for women who have  a serious complication (extreme prematurity) or under emergency situations.  The horizontal and vertical incisions may both be used at the same time. However, this is very uncommon.  An incision is then made in your uterus to deliver the baby.  Your baby will then be delivered.  Both incisions are then closed with absorbable stitches. AFTER THE PROCEDURE   If you were awake during the surgery, you will see your baby right away. If you were asleep, you will see your baby as soon as you are awake.  You may breastfeed your baby after surgery.  You may be able to get up and walk the same day as the surgery. If you need to stay in bed for a period of time, you will receive help to turn, cough, and take deep breaths after surgery. This helps prevent lung problems such as pneumonia.  Do not get out of bed alone the first time after  surgery. You will need help getting out of bed until you are able to do this by yourself.  You may be able to shower the day after your cesarean delivery. After the bandage (dressing) is taken off the incision site, a nurse will assist you to shower if you would like help.  You will have pneumatic compression hose placed on your lower legs. This is done to prevent blood clots. When you are up and walking regularly, they will no longer be necessary.  Do not cross your legs when you sit.  Save any blood clots that you pass. If you pass a clot while on the toilet, do not flush it. Call for the nurse. Tell the nurse if you think you are bleeding too much or passing too many clots.  You will be given medicine as needed. Let your health care providers know if you are hurting. You may also be given an antibiotic to prevent an infection.  Your IV tube will be taken out when you are drinking a reasonable amount of fluids. The Foley catheter is taken out when you are up and walking.  If your blood type is Rh negative and your baby's blood type is Rh positive, you will be  given a shot of anti-D immune globulin. This shot prevents you from having Rh problems with a future pregnancy. You should get the shot even if you had your tubes tied (tubal ligation).  If you are allowed to take the baby for a walk, place the baby in the bassinet and push it. Do not carry your baby in your arms. Document Released: 05/28/2005 Document Revised: 03/18/2013 Document Reviewed: 12/17/2012 [facility] Patient Information 2015 [place], [place]. This information is not intended to replace advice given to you by your health care provider. Make sure you discuss any questions you have with your health care provider.

## 2014-01-01 ENCOUNTER — Ambulatory Visit: Payer: Self-pay

## 2014-01-01 NOTE — Lactation Note (Signed)
This note was copied from the chart of Ashley CockerBoyA [name]. Lactation Consultation Note    Follow up brief consult with this mom of NICU twins, now 599 days old, and 35 5/7 weeks CGA. Mom was pleased to report she is pumping 90 mls each time, but she si only pumping about 4 times a day. I explained that if she continues as she is doing, she will eventually lose her milk supply. I advised mom to pump at least 8 times a day, around the clock, and even more if possible, since she has twins. Mom knows to call for questions/concerns  Patient Name: Ashley Farley ZHYQM'VToday's Date: 01/01/2014     Maternal Data    Feeding Feeding Type: Breast Milk Length of feed: 60 min  LATCH Score/Interventions                      Lactation Tools Discussed/Used     Consult Status      Ashley Farley, Ashley Farley 01/01/2014, 7:36 PM

## 2014-01-06 ENCOUNTER — Encounter (HOSPITAL_COMMUNITY): Payer: Self-pay | Admitting: *Deleted

## 2014-04-12 ENCOUNTER — Encounter (HOSPITAL_COMMUNITY): Payer: Self-pay | Admitting: *Deleted

## 2014-06-10 ENCOUNTER — Emergency Department (HOSPITAL_COMMUNITY)
Admission: EM | Admit: 2014-06-10 | Discharge: 2014-06-10 | Disposition: A | Discharge status: Home / Self Care | Payer: BC Managed Care – PPO | Attending: Emergency Medicine | Admitting: Emergency Medicine

## 2014-06-10 ENCOUNTER — Emergency Department (HOSPITAL_COMMUNITY): Payer: BC Managed Care – PPO

## 2014-06-10 ENCOUNTER — Encounter (HOSPITAL_COMMUNITY): Payer: Self-pay | Admitting: *Deleted

## 2014-06-10 DIAGNOSIS — J069 Acute upper respiratory infection, unspecified: Secondary | ICD-10-CM

## 2014-06-10 DIAGNOSIS — M546 Pain in thoracic spine: Secondary | ICD-10-CM | POA: Diagnosis not present

## 2014-06-10 DIAGNOSIS — Z87442 Personal history of urinary calculi: Secondary | ICD-10-CM | POA: Diagnosis not present

## 2014-06-10 DIAGNOSIS — Z79899 Other long term (current) drug therapy: Secondary | ICD-10-CM | POA: Diagnosis not present

## 2014-06-10 DIAGNOSIS — Z880 Allergy status to penicillin: Secondary | ICD-10-CM | POA: Diagnosis not present

## 2014-06-10 DIAGNOSIS — Z8669 Personal history of other diseases of the nervous system and sense organs: Secondary | ICD-10-CM | POA: Diagnosis not present

## 2014-06-10 DIAGNOSIS — M549 Dorsalgia, unspecified: Secondary | ICD-10-CM

## 2014-06-10 DIAGNOSIS — R05 Cough: Secondary | ICD-10-CM | POA: Diagnosis present

## 2014-06-10 HISTORY — DX: Other enthesopathies, not elsewhere classified: M77.8

## 2014-06-10 LAB — BASIC METABOLIC PANEL
Anion gap: 8 (ref 5–15)
BUN: 6 mg/dL (ref 6–23)
CO2: 25 mmol/L (ref 19–32)
Calcium: 9.2 mg/dL (ref 8.4–10.5)
Chloride: 105 mEq/L (ref 96–112)
Creatinine, Ser: 0.55 mg/dL (ref 0.50–1.10)
GFR calc Af Amer: >90 mL/min (ref >90)
GLUCOSE: 92 mg/dL (ref 70–99)
POTASSIUM: 3.6 mmol/L (ref 3.5–5.1)
Sodium: 138 mmol/L (ref 135–145)

## 2014-06-10 LAB — CBC WITH DIFFERENTIAL/PLATELET
BASOS PCT: 0 % (ref 0–1)
Basophils Absolute: 0 10*3/uL (ref 0.0–0.1)
Eosinophils Absolute: 0.1 10*3/uL (ref 0.0–0.7)
Eosinophils Relative: 1 % (ref 0–5)
HCT: 39.5 % (ref 36.0–46.0)
HEMOGLOBIN: 12.8 g/dL (ref 12.0–15.0)
Lymphocytes Relative: 22 % (ref 12–46)
Lymphs Abs: 2 10*3/uL (ref 0.7–4.0)
MCH: 29.4 pg (ref 26.0–34.0)
MCHC: 32.4 g/dL (ref 30.0–36.0)
MCV: 90.8 fL (ref 78.0–100.0)
Monocytes Absolute: 0.6 10*3/uL (ref 0.1–1.0)
Monocytes Relative: 6 % (ref 3–12)
Neutro Abs: 6.5 10*3/uL (ref 1.7–7.7)
Neutrophils Relative %: 71 % (ref 43–77)
Platelets: 358 10*3/uL (ref 150–400)
RBC: 4.35 MIL/uL (ref 3.87–5.11)
RDW: 13 % (ref 11.5–15.5)
WBC: 9.2 10*3/uL (ref 4.0–10.5)

## 2014-06-10 LAB — D-DIMER, QUANTITATIVE: D-Dimer, Quant: 0.27 ug/mL-FEU (ref 0.00–0.48)

## 2014-06-10 MED ORDER — BENZONATATE 100 MG PO CAPS: 100.0000 mg | ORAL_CAPSULE | Freq: Three times a day (TID) | ORAL | Status: DC | PRN

## 2014-06-10 MED ORDER — KETOROLAC TROMETHAMINE 30 MG/ML IJ SOLN
30.0000 mg | Freq: Once | INTRAMUSCULAR | Status: AC
  Administered 2014-06-10: 30 mg via INTRAVENOUS
  Filled 2014-06-10: qty 1

## 2014-06-10 NOTE — ED Notes (Addendum)
Pt reports mid R side back pain x1 week, progressively worsening. Worse with certain movements. Feels like a piercing pain, worse with deep breath. Sts she could not lift her infants today due to pain, which is why she came today. Cough x3 days. Green mucus. Sinus drainage.

## 2014-06-10 NOTE — ED Provider Notes (Signed)
CSN: [id number]     Arrival date & time 06/10/14  0906 History   First MD Initiated Contact with Patient 06/10/14 [phone]     Chief Complaint  Patient presents with  . Cough  . Back Pain     (Consider location/radiation/quality/duration/timing/severity/associated sxs/prior Treatment) HPI  33 year old female presents with right upper back pain for the past 1 week. Pain was mild to moderate and constant for about one week. Last night the pain significantly worsened. The patient's pain is worst when taking a deep breath. Is also worse when taking certain movements. If she leans to the left her pain feels better. The patient's pain is exacerbated by picking up her new twins. Has now developed a productive cough and congestion and sore throat over the past 3 days but the back pain started first. No chest pain. No rash. No fevers. Prior history of lower extremity swelling or prior history of DVT. Has taken ibuprofen 400 mg with no significant relief. Patient has a child sick at home with bronchiolitis. She thinks she may have slept on something wrong but denies any direct trauma. No weakness, numbness or midline pain.  Past Medical History  Diagnosis Date  . Interstitial cystitis   . Kidney stones   . Carpal tunnel syndrome   . Sciatica   . Tendonitis of wrist, left    Past Surgical History  Procedure Laterality Date  . Dilation and curettage of uterus    . Laparoscopy  01/22/2011    Procedure: LAPAROSCOPY DIAGNOSTIC;  Surgeon: Zelphia CairoGretchen Adkins;  Location: [facility];  Service: Gynecology;  Laterality: N/A;  . Cesarean section N/A 12/23/2013    Procedure: CESAREAN SECTION;  Surgeon: Esmeralda ArthurSandra A Rivard, MD;  Location: [facility];  Service: Obstetrics;  Laterality: N/A;   Family History  Problem Relation Age of Onset  . Diabetes Mother   . Hypertension Mother   . Diabetes Father   . Hypertension Father   . Cancer Father   . Stroke Maternal Grandmother    History  Substance Use Topics  . Smoking  status: Never Smoker   . Smokeless tobacco: Not on file  . Alcohol Use: Yes     Comment: socially   OB History    Gravida Para Term Preterm AB TAB SAB Ectopic Multiple Living   6 1  1 5  5  1 3      Review of Systems  Constitutional: Negative for fever.  HENT: Positive for congestion and sore throat.   Respiratory: Positive for cough. Negative for shortness of breath.   Gastrointestinal: Negative for vomiting and abdominal pain.  Musculoskeletal: Positive for back pain.  Neurological: Negative for weakness and numbness.  All other systems reviewed and are negative.     Allergies  Amoxicillin and Vicodin  Home Medications   Prior to Admission medications   Medication Sig Start Date End Date Taking? Authorizing Provider  ferrous sulfate 325 (65 FE) MG tablet Take 1 tablet (325 mg total) by mouth 2 (two) times daily with a meal. 12/25/13   Venus Standard, CNM  oxyCODONE-acetaminophen (PERCOCET/ROXICET) 5-325 MG per tablet Take 1-2 tablets by mouth every 4 (four) hours as needed for severe pain (moderate - severe pain). 12/25/13   Venus Standard, CNM  Prenatal Vit-Fe Fumarate-FA (PRENATAL MULTIVITAMIN) TABS tablet Take 1 tablet by mouth daily at 12 noon.    Historical Provider, MD   BP 140/77 mmHg  Pulse 75  Temp(Src) 98.1 F (36.7 C) (Oral)  Resp 16  SpO2 100%  Breastfeeding? No Physical Exam  Constitutional: She is oriented to person, place, and time. She appears well-developed and well-nourished.  HENT:  Head: Normocephalic and atraumatic.  Right Ear: External ear normal.  Left Ear: External ear normal.  Nose: Nose normal.  Eyes: Right eye exhibits no discharge. Left eye exhibits no discharge.  Neck: Neck supple.  Cardiovascular: Normal rate, regular rhythm and normal heart sounds.   Pulmonary/Chest: Effort normal and breath sounds normal.  Abdominal: Soft. She exhibits no distension.  Musculoskeletal:       Cervical back: She exhibits decreased range of motion. She  exhibits no tenderness and no bony tenderness.       Thoracic back: She exhibits decreased range of motion. She exhibits no tenderness and no bony tenderness.       Lumbar back: She exhibits no bony tenderness.  No focal area of tenderness. Points to medial scapula on right as source of pain but no spasm or significantly reproducible tenderness. Mild limited ROM of neck/back due to causing this area to hurt  Neurological: She is alert and oriented to person, place, and time.  Skin: Skin is warm and dry. She is not diaphoretic.  Nursing note and vitals reviewed.   ED Course  Procedures (including critical care time) Labs Review Labs Reviewed  CBC WITH DIFFERENTIAL  BASIC METABOLIC PANEL  D-DIMER, QUANTITATIVE    Imaging Review Dg Chest 2 View  06/10/2014   CLINICAL DATA:  New right posterior chest pain at the level of the scapula for the past 5 days. Some cough and chest congestion.  EXAM: CHEST  2 VIEW  COMPARISON:  None.  FINDINGS: The heart size and mediastinal contours are within normal limits. Both lungs are clear. Mild peribronchial thickening. The visualized skeletal structures are unremarkable.  IMPRESSION: Mild bronchitic changes.   Electronically Signed   By: Gordan PaymentSteve  Reid M.D.   On: 06/10/2014 09:41     EKG Interpretation None      MDM   Final diagnoses:  Back pain  Right-sided thoracic back pain  Upper respiratory infection    Patient with upper back pain is likely musculoskeletal. It worsens with any type of movement. Given the pleuritic symptoms and being postpartum for a couple months, d-dimer sent she is low risk for PE with no other risk factors, normal heart rate, and no hypoxia. This is negative and I feel that pulmonary embolism is ruled out. Pain somewhat improved with Toradol but she does not want any narcotic or muscle relaxant. At this point she feels comfortable going home, likely has upper respiratory infection complicating this. We'll treat cough,  recommend exercises, and follow up with PCP.    Audree CamelScott T Asli Tokarski, MD 06/10/14 [phone]

## 2014-06-10 NOTE — Discharge Instructions (Signed)

## 2014-06-10 NOTE — ED Notes (Signed)
MD at bedside. 

## 2014-06-18 DIAGNOSIS — E559 Vitamin D deficiency, unspecified: Secondary | ICD-10-CM | POA: Insufficient documentation

## 2014-06-18 DIAGNOSIS — F4322 Adjustment disorder with anxiety: Secondary | ICD-10-CM | POA: Insufficient documentation

## 2014-06-18 HISTORY — DX: Vitamin D deficiency, unspecified: E55.9

## 2016-02-29 DIAGNOSIS — L918 Other hypertrophic disorders of the skin: Secondary | ICD-10-CM | POA: Insufficient documentation

## 2016-03-13 ENCOUNTER — Other Ambulatory Visit: Payer: Self-pay | Admitting: Surgery

## 2016-03-13 DIAGNOSIS — N63 Unspecified lump in unspecified breast: Secondary | ICD-10-CM

## 2016-03-13 DIAGNOSIS — N611 Abscess of the breast and nipple: Secondary | ICD-10-CM

## 2016-03-28 ENCOUNTER — Ambulatory Visit
Admission: RE | Admit: 2016-03-28 | Discharge: 2016-03-28 | Disposition: A | Discharge status: Home / Self Care | Payer: BLUE CROSS/BLUE SHIELD | Source: Ambulatory Visit | Attending: Surgery | Admitting: Surgery

## 2016-03-28 DIAGNOSIS — N63 Unspecified lump in unspecified breast: Secondary | ICD-10-CM

## 2016-03-28 DIAGNOSIS — N611 Abscess of the breast and nipple: Secondary | ICD-10-CM

## 2016-07-31 ENCOUNTER — Other Ambulatory Visit: Payer: Self-pay | Admitting: Family Medicine

## 2016-07-31 DIAGNOSIS — R2232 Localized swelling, mass and lump, left upper limb: Secondary | ICD-10-CM | POA: Insufficient documentation

## 2016-08-07 ENCOUNTER — Other Ambulatory Visit: Payer: BLUE CROSS/BLUE SHIELD

## 2016-08-29 ENCOUNTER — Emergency Department (HOSPITAL_COMMUNITY)
Admission: EM | Admit: 2016-08-29 | Discharge: 2016-08-29 | Disposition: A | Discharge status: Home / Self Care | Payer: BLUE CROSS/BLUE SHIELD | Attending: Emergency Medicine | Admitting: Emergency Medicine

## 2016-08-29 ENCOUNTER — Emergency Department (HOSPITAL_COMMUNITY): Payer: BLUE CROSS/BLUE SHIELD

## 2016-08-29 ENCOUNTER — Encounter (HOSPITAL_COMMUNITY): Payer: Self-pay

## 2016-08-29 DIAGNOSIS — R1031 Right lower quadrant pain: Secondary | ICD-10-CM | POA: Insufficient documentation

## 2016-08-29 HISTORY — DX: Endometriosis, unspecified: N80.9

## 2016-08-29 HISTORY — DX: Vitamin D deficiency, unspecified: E55.9

## 2016-08-29 LAB — CBC WITH DIFFERENTIAL/PLATELET
BASOS ABS: 0 10*3/uL (ref 0.0–0.1)
BASOS PCT: 1 %
EOS ABS: 0.2 10*3/uL (ref 0.0–0.7)
EOS PCT: 3 %
HCT: 37.8 % (ref 36.0–46.0)
Hemoglobin: 12.6 g/dL (ref 12.0–15.0)
LYMPHS PCT: 31 %
Lymphs Abs: 2.2 10*3/uL (ref 0.7–4.0)
MCH: 29.5 pg (ref 26.0–34.0)
MCHC: 33.3 g/dL (ref 30.0–36.0)
MCV: 88.5 fL (ref 78.0–100.0)
MONO ABS: 0.4 10*3/uL (ref 0.1–1.0)
Monocytes Relative: 6 %
Neutro Abs: 4.3 10*3/uL (ref 1.7–7.7)
Neutrophils Relative %: 59 %
Platelets: 273 10*3/uL (ref 150–400)
RBC: 4.27 MIL/uL (ref 3.87–5.11)
RDW: 12.2 % (ref 11.5–15.5)
WBC: 7.2 10*3/uL (ref 4.0–10.5)

## 2016-08-29 LAB — COMPREHENSIVE METABOLIC PANEL
ALT: 23 U/L (ref 14–54)
AST: 21 U/L (ref 15–41)
Albumin: 4.4 g/dL (ref 3.5–5.0)
Alkaline Phosphatase: 79 U/L (ref 38–126)
Anion gap: 8 (ref 5–15)
BILIRUBIN TOTAL: 1 mg/dL (ref 0.3–1.2)
BUN: 13 mg/dL (ref 6–20)
CALCIUM: 9.5 mg/dL (ref 8.9–10.3)
CHLORIDE: 105 mmol/L (ref 101–111)
CO2: 25 mmol/L (ref 22–32)
Creatinine, Ser: 0.64 mg/dL (ref 0.44–1.00)
GFR calc non Af Amer: >60 mL/min (ref >60)
Glucose, Bld: 91 mg/dL (ref 65–99)
Potassium: 3.6 mmol/L (ref 3.5–5.1)
Sodium: 138 mmol/L (ref 135–145)
TOTAL PROTEIN: 7.8 g/dL (ref 6.5–8.1)

## 2016-08-29 LAB — I-STAT BETA HCG BLOOD, ED (MC, WL, AP ONLY)

## 2016-08-29 LAB — LIPASE, BLOOD: Lipase: 28 U/L (ref 11–51)

## 2016-08-29 MED ORDER — IOPAMIDOL (ISOVUE-300) INJECTION 61%
INTRAVENOUS | Status: AC
  Administered 2016-08-29: 100 mL via INTRAVENOUS
  Filled 2016-08-29: qty 100

## 2016-08-29 MED ORDER — ONDANSETRON HCL 4 MG/2ML IJ SOLN
4.0000 mg | Freq: Once | INTRAMUSCULAR | Status: AC
  Administered 2016-08-29: 4 mg via INTRAVENOUS
  Filled 2016-08-29: qty 2

## 2016-08-29 MED ORDER — IOPAMIDOL (ISOVUE-300) INJECTION 61%
INTRAVENOUS | Status: AC
  Filled 2016-08-29: qty 30

## 2016-08-29 MED ORDER — SODIUM CHLORIDE 0.9 % IV BOLUS (SEPSIS)
1000.0000 mL | Freq: Once | INTRAVENOUS | Status: AC
  Administered 2016-08-29: 1000 mL via INTRAVENOUS

## 2016-08-29 MED ORDER — IOPAMIDOL (ISOVUE-300) INJECTION 61%
15.0000 mL | Freq: Once | INTRAVENOUS | Status: DC | PRN
  Administered 2016-08-29: 30 mL via ORAL
  Filled 2016-08-29: qty 30

## 2016-08-29 MED ORDER — SODIUM CHLORIDE 0.9 % IJ SOLN
INTRAMUSCULAR | Status: AC
  Filled 2016-08-29: qty 50

## 2016-08-29 MED ORDER — FENTANYL CITRATE (PF) 100 MCG/2ML IJ SOLN
25.0000 ug | Freq: Once | INTRAMUSCULAR | Status: AC
  Administered 2016-08-29: 25 ug via INTRAVENOUS
  Filled 2016-08-29: qty 2

## 2016-08-29 MED ORDER — SODIUM CHLORIDE 0.9 % IV SOLN
INTRAVENOUS | Status: DC
Start: 2016-08-29 — End: 2016-08-29
  Administered 2016-08-29: 14:00:00 via INTRAVENOUS

## 2016-08-29 MED ORDER — TRAMADOL HCL 50 MG PO TABS: 50.0000 mg | ORAL_TABLET | Freq: Four times a day (QID) | ORAL | 0 refills | Status: DC | PRN

## 2016-08-29 NOTE — ED Notes (Signed)
Discharge instructions, follow up care, and rx x1 reviewed with patient. Patient verbalized understanding. 

## 2016-08-29 NOTE — ED Provider Notes (Signed)
[facility]-EMERGENCY DEPT Provider Note   CSN: [id number] Arrival date & time: 08/29/16  1210     History   Chief Complaint Chief Complaint  Patient presents with  . Abdominal Pain  . Emesis  . Headache    HPI Ashley Farley is a 36 y.o. female.  HPI  Patient presents with ongoing abdominal pain, now worse over the past 2 days per She notes that she has had abdominal pain for years, typically in the lower abdomen, attributed to endometriosis per She has had evaluation, management performed by her gynecologist. With concern that there may repeat more contributing to her abdominal pain she was referred to primary care. Her primary care physician referred her here for evaluation. She notes the pain currently is focally in the right lower quadrant, though there is mild diffuse radiation pain is sore, severe, no medication taken for release today. There is associated nausea, but no vomiting, no diarrhea.  No vaginal changes.  Past Medical History:  Diagnosis Date  . Carpal tunnel syndrome   . Endometriosis   . Interstitial cystitis   . Kidney stones   . Sciatica   . Tendonitis of wrist, left   . Vitamin D deficiency     Patient Active Problem List   Diagnosis Date Noted  . Anemia 12/26/2013  . Pre-eclampsia, severe, delivered 12/23/2013  . Preeclampsia 12/16/2013  . Twins--di/di 11/26/2013  . Short cervix--1.36 on [place] today 11/26/2013  . Kidney stones--hx 11/26/2013  . H/O infertility--IVF pregnancy 11/26/2013    Past Surgical History:  Procedure Laterality Date  . CESAREAN SECTION N/A 12/23/2013   Procedure: CESAREAN SECTION;  Surgeon: Esmeralda Arthur, MD;  Location: [facility];  Service: Obstetrics;  Laterality: N/A;  . DILATION AND CURETTAGE OF UTERUS    . LAPAROSCOPY  01/22/2011   Procedure: LAPAROSCOPY DIAGNOSTIC;  Surgeon: Zelphia Cairo;  Location: [facility];  Service: Gynecology;  Laterality: N/A;    OB History    Gravida Para Term Preterm AB Living   6 1   1 5 3    SAB TAB Ectopic Multiple Live Births   5     1 3        Home Medications    Prior to Admission medications   Medication Sig Start Date End Date Taking? Authorizing Provider  cetirizine (ZYRTEC) 10 MG tablet Take 10 mg by mouth See admin instructions. Alternates taking with Claritin every other week but only takes every other day   Yes Historical Provider, MD  ibuprofen (ADVIL,MOTRIN) 600 MG tablet Take 600 mg by mouth every 5 (five) hours as needed for fever, headache, mild pain, moderate pain or cramping.   Yes Historical Provider, MD  levonorgestrel (MIRENA) 20 MCG/24HR IUD 1 each by Intrauterine route once. Placed 02/2014   Yes Historical Provider, MD  loratadine (CLARITIN) 10 MG tablet Take 10 mg by mouth See admin instructions. Alternates taking with Cetirizine every other week but only takes every other day   Yes Historical Provider, MD    Family History Family History  Problem Relation Age of Onset  . Diabetes Mother   . Hypertension Mother   . Diabetes Father   . Hypertension Father   . Cancer Father   . Stroke Maternal Grandmother     Social History Social History  Substance Use Topics  . Smoking status: Never Smoker  . Smokeless tobacco: Never Used  . Alcohol use Yes     Comment: socially     Allergies   Amoxicillin and Vicodin [  hydrocodone-acetaminophen]   Review of Systems Review of Systems  Constitutional:       Per HPI, otherwise negative  HENT:       Per HPI, otherwise negative  Respiratory:       Per HPI, otherwise negative  Cardiovascular:       Per HPI, otherwise negative  Gastrointestinal: Positive for abdominal pain and nausea. Negative for vomiting.  Endocrine:       Negative aside from HPI  Genitourinary:       Neg aside from HPI   Musculoskeletal:       Per HPI, otherwise negative  Skin: Negative.   Neurological: Negative for syncope.     Physical Exam Updated Vital Signs BP 117/72 (BP Location: Right Arm)   Pulse (!) 57   Temp  97.8 F (36.6 C) (Oral)   Resp 16   Ht 5' (1.524 m)   Wt 121 lb (54.9 kg)   SpO2 100%   BMI 23.63 kg/m   Physical Exam  Constitutional: She is oriented to person, place, and time. She appears well-developed and well-nourished. No distress.  HENT:  Head: Normocephalic and atraumatic.  Eyes: Conjunctivae and EOM are normal.  Cardiovascular: Normal rate and regular rhythm.   Pulmonary/Chest: Effort normal and breath sounds normal. No stridor. No respiratory distress.  Abdominal: She exhibits no distension.  Tenderness to palpation throughout the right lower quadrant, no guarding, no rebound  Musculoskeletal: She exhibits no edema.  Neurological: She is alert and oriented to person, place, and time. No cranial nerve deficit.  Skin: Skin is warm and dry.  Psychiatric: She has a normal mood and affect.  Nursing note and vitals reviewed.    ED Treatments / Results  Labs (all labs ordered are listed, but only abnormal results are displayed) Labs Reviewed  COMPREHENSIVE METABOLIC PANEL  LIPASE, BLOOD  CBC WITH DIFFERENTIAL/PLATELET  I-STAT BETA HCG BLOOD, ED ([facility], [facility], [facility] ONLY)    Radiology Ct Abdomen Pelvis W Contrast  Result Date: 08/29/2016 CLINICAL DATA:  Chronic right lower quadrant pain, increasing pain and right leg pain for last 2 days EXAM: CT ABDOMEN AND PELVIS WITH CONTRAST TECHNIQUE: Multidetector CT imaging of the abdomen and pelvis was performed using the standard protocol following bolus administration of intravenous contrast. CONTRAST:  1 ISOVUE-300 IOPAMIDOL (ISOVUE-300) INJECTION 61% COMPARISON:  None. FINDINGS: Lower chest: Lung bases shows no acute findings Hepatobiliary: Enhanced liver demonstrates no focal mass. Gallbladder is contracted without evidence of calcified gallstones. There is no biliary ductal dilatation. Pancreas: Unremarkable. No pancreatic ductal dilatation or surrounding inflammatory changes. Spleen: Normal in size without focal abnormality.  Adrenals/Urinary Tract: No adrenal gland mass. Enhanced kidneys are symmetrical in size. The there is a cyst in upper pole posterior aspect of the right kidney measures 7 mm. Cyst in upper pole of the left kidney lateral aspect measures 7 mm. There is nonobstructive calcified calculus in midpole of the right kidney measures 4 mm. Please see axial image 23. No hydronephrosis or hydroureter. Bilateral no calcified ureteral calculi are noted. No calcified calculi are noted within urinary bladder. Stomach/Bowel: No small bowel obstruction. Oral contrast material was given to the patient. No thickened or dilated small bowel loops. No pericecal inflammation. Normal retrocecal appendix is noted in axial image 49. Some colonic stool noted in right colon and transverse colon. Some colonic stool noted in and descending colon. Mild gas noted in mid sigmoid colon. No distal colitis or diverticulitis. Some colonic gas noted within rectum. Vascular/Lymphatic: No aortic  aneurysm. No retroperitoneal or mesenteric adenopathy. Reproductive: The uterus is retroflexed. IUD in place is noted. There is a cyst within left ovary measures 2.1 by 1.2 cm. Measures 10.7 Hounsfield units in attenuation consistent with simple cyst. No pelvic free fluid is noted. Other: No ascites or free abdominal air.  No inguinal adenopathy. Musculoskeletal: No destructive bony lesions are noted. IMPRESSION: 1. There is no evidence of acute inflammatory process within abdomen. 2. Small bilateral renal cysts. No hydronephrosis or hydroureter. Right nonobstructive nephrolithiasis. No calcified ureteral calculi. 3. No small bowel or colonic obstruction. 4. Normal appendix.  No pericecal inflammation. 5. IUD in place within retroflexed uterus. There is a simple cyst within left ovary measures 2.1 cm x 1.2 cm. No pelvic free fluid. Electronically Signed   By: Natasha Mead M.D.   On: 08/29/2016 15:46    Procedures Procedures (including critical care  time)  Medications Ordered in ED Medications  sodium chloride 0.9 % bolus 1,000 mL (0 mLs Intravenous Stopped 08/29/16 1549)    And  0.9 %  sodium chloride infusion ( Intravenous New Bag/Given 08/29/16 1340)  iopamidol (ISOVUE-300) 61 % injection 15 mL (30 mLs Oral Contrast Given 08/29/16 1345)  iopamidol (ISOVUE-300) 61 % injection (not administered)  sodium chloride 0.9 % injection (not administered)  fentaNYL (SUBLIMAZE) injection 25 mcg (25 mcg Intravenous Given 08/29/16 1343)  ondansetron (ZOFRAN) injection 4 mg (4 mg Intravenous Given 08/29/16 1342)  iopamidol (ISOVUE-300) 61 % injection (100 mLs Intravenous Contrast Given 08/29/16 1518)     Initial Impression / Assessment and Plan / ED Course  I have reviewed the triage vital signs and the nursing notes.  Pertinent labs & imaging results that were available during my care of the patient were reviewed by me and considered in my medical decision making (see chart for details).  On repeat exam after provision of fentanyl, the patient is substantially more calm. I reviewed all findings with her at length. With reassuring labs, CT, patient progress for discharge with outpatient follow-up. Sepsis patient for acute on chronic abdominal pain, though there is evidence for at least one kidney stone, and the patient may have recently passed one of these per Evidence for appendicitis, or other new GI pathology.   Final Clinical Impressions(s) / ED Diagnoses  Abdominal pain   Gerhard Munch, MD 08/29/16 [phone]

## 2016-08-29 NOTE — Discharge Instructions (Signed)
As discussed, your evaluation today has been largely reassuring.  But, it is important that you monitor your condition carefully, and do not hesitate to return to the ED if you develop new, or concerning changes in your condition.  Your pain may be due to a recently passed kidney stone, or exacerbation of your known disease.  Please follow-up with your physician for appropriate ongoing care.

## 2016-08-29 NOTE — ED Triage Notes (Signed)
Pt c/o chronic RLQ pain radiating down R leg x "a while" increasing x 2 days and n/v and headache starting today.  Pain score 10/10.  Sts light sensitivity.  Pt reports taking ibuprofen w/ some relief.  Pt reports that she is followed by OBGYN for abdominal pain, but was directed to ED by PCP for other symptoms.  Hx of endometriosis.

## 2016-09-03 DIAGNOSIS — N3010 Interstitial cystitis (chronic) without hematuria: Secondary | ICD-10-CM | POA: Insufficient documentation

## 2016-09-18 ENCOUNTER — Other Ambulatory Visit: Payer: BLUE CROSS/BLUE SHIELD

## 2017-02-22 DIAGNOSIS — N83201 Unspecified ovarian cyst, right side: Secondary | ICD-10-CM | POA: Insufficient documentation

## 2017-03-14 DIAGNOSIS — J302 Other seasonal allergic rhinitis: Secondary | ICD-10-CM | POA: Insufficient documentation

## 2017-04-08 DIAGNOSIS — G5600 Carpal tunnel syndrome, unspecified upper limb: Secondary | ICD-10-CM | POA: Insufficient documentation

## 2017-04-08 DIAGNOSIS — D261 Other benign neoplasm of corpus uteri: Secondary | ICD-10-CM | POA: Insufficient documentation

## 2017-04-08 DIAGNOSIS — J4530 Mild persistent asthma, uncomplicated: Secondary | ICD-10-CM | POA: Insufficient documentation

## 2017-04-08 DIAGNOSIS — N809 Endometriosis, unspecified: Secondary | ICD-10-CM | POA: Insufficient documentation

## 2017-04-08 HISTORY — DX: Carpal tunnel syndrome, unspecified upper limb: G56.00

## 2017-04-18 ENCOUNTER — Encounter: Payer: Self-pay | Admitting: Pulmonary Disease

## 2017-04-18 ENCOUNTER — Ambulatory Visit: Payer: BLUE CROSS/BLUE SHIELD | Admitting: Pulmonary Disease

## 2017-04-18 VITALS — BP 102/78 | HR 85 | Ht 60.0 in | Wt 126.0 lb

## 2017-04-18 DIAGNOSIS — J301 Allergic rhinitis due to pollen: Secondary | ICD-10-CM

## 2017-04-18 DIAGNOSIS — R05 Cough: Secondary | ICD-10-CM

## 2017-04-18 DIAGNOSIS — K219 Gastro-esophageal reflux disease without esophagitis: Secondary | ICD-10-CM

## 2017-04-18 DIAGNOSIS — Z23 Encounter for immunization: Secondary | ICD-10-CM | POA: Diagnosis not present

## 2017-04-18 DIAGNOSIS — R059 Cough, unspecified: Secondary | ICD-10-CM

## 2017-04-18 LAB — NITRIC OXIDE: Nitric Oxide: 20

## 2017-04-18 MED ORDER — TRIAMCINOLONE ACETONIDE 55 MCG/ACT NA AERO: 2.0000 | INHALATION_SPRAY | Freq: Every day | NASAL | 12 refills | Status: AC

## 2017-04-18 NOTE — Progress Notes (Deleted)
Subjective:    Patient ID: Ashley Farley, female    DOB: 23-Dec-1980, 36 y.o.   MRN: [id number]  Synopsis:  ***  HPI No chief complaint on file.    Past Medical History:  Diagnosis Date  . Carpal tunnel syndrome   . Endometriosis   . Interstitial cystitis   . Kidney stones   . Sciatica   . Tendonitis of wrist, left   . Vitamin D deficiency      Family History  Problem Relation Age of Onset  . Diabetes Mother   . Hypertension Mother   . Diabetes Father   . Hypertension Father   . Cancer Father   . Stroke Maternal Grandmother      Social History   Socioeconomic History  . Marital status: Married    Spouse name: Not on file  . Number of children: Not on file  . Years of education: Not on file  . Highest education level: Not on file  Social Needs  . Financial resource strain: Not on file  . Food insecurity - worry: Not on file  . Food insecurity - inability: Not on file  . Transportation needs - medical: Not on file  . Transportation needs - non-medical: Not on file  Occupational History  . Not on file  Tobacco Use  . Smoking status: Never Smoker  . Smokeless tobacco: Never Used  Substance and Sexual Activity  . Alcohol use: Yes    Comment: socially  . Drug use: No  . Sexual activity: Not Currently    Birth control/protection: IUD  Other Topics Concern  . Not on file  Social History Narrative  . Not on file     Allergies  Allergen Reactions  . Amoxicillin Hives and Nausea And Vomiting    Has patient had a PCN reaction causing immediate rash, facial/tongue/throat swelling, SOB or lightheadedness with hypotension: Yes Has patient had a PCN reaction causing severe rash involving mucus membranes or skin necrosis: No Has patient had a PCN reaction that required hospitalization No Has patient had a PCN reaction occurring within the last 10 years: Yes If all of the above answers are "NO", then may proceed with Cephalosporin use.   . Vicodin  [Hydrocodone-Acetaminophen] Nausea And Vomiting and Rash     Outpatient Medications Prior to Visit  Medication Sig Dispense Refill  . cetirizine (ZYRTEC) 10 MG tablet Take 10 mg by mouth See admin instructions. Alternates taking with Claritin every other week but only takes every other day    . ibuprofen (ADVIL,MOTRIN) 600 MG tablet Take 600 mg by mouth every 5 (five) hours as needed for fever, headache, mild pain, moderate pain or cramping.    Marland Kitchen. levonorgestrel (MIRENA) 20 MCG/24HR IUD 1 each by Intrauterine route once. Placed 02/2014    . loratadine (CLARITIN) 10 MG tablet Take 10 mg by mouth See admin instructions. Alternates taking with Cetirizine every other week but only takes every other day    . traMADol (ULTRAM) 50 MG tablet Take 1 tablet (50 mg total) by mouth every 6 (six) hours as needed. 15 tablet 0   No facility-administered medications prior to visit.       Review of Systems  HENT: Positive for postnasal drip, sinus pressure and sneezing.   Respiratory: Positive for cough, chest tightness, shortness of breath and wheezing.   Cardiovascular: Positive for palpitations.  Allergic/Immunologic: Positive for environmental allergies.       Objective:   Physical Exam  There were no  vitals filed for this visit.  ***  CBC    Component Value Date/Time   WBC 7.2 08/29/2016 1325   RBC 4.27 08/29/2016 1325   HGB 12.6 08/29/2016 1325   HGB 13.5 03/30/2009 1418   HCT 37.8 08/29/2016 1325   HCT 40.1 03/30/2009 1418   PLT 273 08/29/2016 1325   PLT 230 03/30/2009 1418   MCV 88.5 08/29/2016 1325   MCV 91.1 03/30/2009 1418   MCH 29.5 08/29/2016 1325   MCHC 33.3 08/29/2016 1325   RDW 12.2 08/29/2016 1325   RDW 12.0 03/30/2009 1418   LYMPHSABS 2.2 08/29/2016 1325   LYMPHSABS 2.5 03/30/2009 1418   MONOABS 0.4 08/29/2016 1325   MONOABS 0.4 03/30/2009 1418   EOSABS 0.2 08/29/2016 1325   EOSABS 0.1 03/30/2009 1418   BASOSABS 0.0 08/29/2016 1325   BASOSABS 0.1 03/30/2009 1418    BMET    Component Value Date/Time   NA 138 08/29/2016 1325   K 3.6 08/29/2016 1325   CL 105 08/29/2016 1325   CO2 25 08/29/2016 1325   GLUCOSE 91 08/29/2016 1325   BUN 13 08/29/2016 1325   CREATININE 0.64 08/29/2016 1325   CALCIUM 9.5 08/29/2016 1325   GFRNONAA >60 08/29/2016 1325   GFRAA >60 08/29/2016 1325         Assessment & Plan:    No diagnosis found.  Discussion: ***    Current Outpatient Medications:  .  cetirizine (ZYRTEC) 10 MG tablet, Take 10 mg by mouth See admin instructions. Alternates taking with Claritin every other week but only takes every other day, Disp: , Rfl:  .  ibuprofen (ADVIL,MOTRIN) 600 MG tablet, Take 600 mg by mouth every 5 (five) hours as needed for fever, headache, mild pain, moderate pain or cramping., Disp: , Rfl:  .  levonorgestrel (MIRENA) 20 MCG/24HR IUD, 1 each by Intrauterine route once. Placed 02/2014, Disp: , Rfl:  .  loratadine (CLARITIN) 10 MG tablet, Take 10 mg by mouth See admin instructions. Alternates taking with Cetirizine every other week but only takes every other day, Disp: , Rfl:  .  traMADol (ULTRAM) 50 MG tablet, Take 1 tablet (50 mg total) by mouth every 6 (six) hours as needed., Disp: 15 tablet, Rfl: 0

## 2017-04-18 NOTE — Progress Notes (Signed)
Subjective:    Patient ID: Ashley Farley, female    DOB: 1981-05-22, 36 y.o.   MRN: [id number]  Synopsis: Referred in 2018 for upper airway cough syndrome.  HPI Chief Complaint  Patient presents with  . Advice Only    multiple episodes of coughing with chest tightness and wheezing, seen at urgent care multiple times over the past year, albuterol and prednisone helps    Merina is here to see me for a cough that hasn't gone away throughout the year. > occurs repeatedly throughout the year > has been to Urgent Care many times for evaluation of the same > often given antibiotics > about three weeks ago she had a really bad spell with cough, mucus production > she used albuterol 3 weeks ago, this really helped > dust makes it worse > eating doesn't affect the cough > cough is worse when lying flat > she coughs up mucus frequently, its green-ish white  She also notes some shortness of breath > started about 4 years ago > she was prescribed anxiety medicines for this but she says that it didn't help > when she would visit [place] it was worse in the subway stations > sometimes with exertion it was worse > this has been persistent for the last 4 years, perhaps getting worse > wheezing started 4 months ago > some dyspnea when she lay flat > no leg swelling  She has never been told that she had a lung problem.  Childhood was normal.  She has had bad allergies and takes Zyrtec.  She has been seen by an allergist while she was told she had a lot of allergies.  She was not offered.  Typically in the fall her cough is worse, spring is bad too.   > she still has post nasal drip at this point > she is taking Zyrtec right now, nothing else  She will have heartburn, indigestion from time to time, maybe every other week.   Past Medical History:  Diagnosis Date  . Carpal tunnel syndrome   . Endometriosis   . Interstitial cystitis   . Kidney stones   . Sciatica   . Tendonitis of wrist, left     . Vitamin D deficiency      Family History  Problem Relation Age of Onset  . Diabetes Mother   . Hypertension Mother   . Diabetes Father   . Hypertension Father   . Cancer Father   . Lung cancer Father   . Stroke Maternal Grandmother      Social History   Socioeconomic History  . Marital status: Married    Spouse name: Not on file  . Number of children: Not on file  . Years of education: Not on file  . Highest education level: Not on file  Social Needs  . Financial resource strain: Not on file  . Food insecurity - worry: Not on file  . Food insecurity - inability: Not on file  . Transportation needs - medical: Not on file  . Transportation needs - non-medical: Not on file  Occupational History  . Not on file  Tobacco Use  . Smoking status: Never Smoker  . Smokeless tobacco: Never Used  Substance and Sexual Activity  . Alcohol use: Yes    Comment: socially  . Drug use: No  . Sexual activity: Not Currently    Birth control/protection: IUD  Other Topics Concern  . Not on file  Social History Narrative  . Not  on file     Allergies  Allergen Reactions  . Oxycodone Hives and Nausea Only  . Ibuprofen Swelling  . Amoxicillin Hives and Nausea And Vomiting    Has patient had a PCN reaction causing immediate rash, facial/tongue/throat swelling, SOB or lightheadedness with hypotension: Yes Has patient had a PCN reaction causing severe rash involving mucus membranes or skin necrosis: No Has patient had a PCN reaction that required hospitalization No Has patient had a PCN reaction occurring within the last 10 years: Yes If all of the above answers are "NO", then may proceed with Cephalosporin use.   Marland Kitchen Hydrocodone-Acetaminophen Nausea And Vomiting and Rash  . Vicodin [Hydrocodone-Acetaminophen] Nausea And Vomiting and Rash     Outpatient Medications Prior to Visit  Medication Sig Dispense Refill  . cetirizine (ZYRTEC) 10 MG tablet Take 10 mg by mouth See admin  instructions. Alternates taking with Claritin every other week but only takes every other day    . loratadine (CLARITIN) 10 MG tablet Take 10 mg by mouth See admin instructions. Alternates taking with Cetirizine every other week but only takes every other day    . traMADol (ULTRAM) 50 MG tablet Take 1 tablet (50 mg total) by mouth every 6 (six) hours as needed. 15 tablet 0  . ibuprofen (ADVIL,MOTRIN) 600 MG tablet Take 600 mg by mouth every 5 (five) hours as needed for fever, headache, mild pain, moderate pain or cramping.    Marland Kitchen levonorgestrel (MIRENA) 20 MCG/24HR IUD 1 each by Intrauterine route once. Placed 02/2014     No facility-administered medications prior to visit.        Review of Systems  Constitutional: Negative for appetite change, chills, diaphoresis, fatigue and fever.  HENT: Positive for rhinorrhea and sinus pressure. Negative for congestion, hearing loss, nosebleeds, postnasal drip, sore throat and trouble swallowing.   Eyes: Negative for discharge, redness and visual disturbance.  Respiratory: Positive for cough, shortness of breath and wheezing. Negative for choking and chest tightness.   Cardiovascular: Negative for chest pain and leg swelling.  Gastrointestinal: Negative for abdominal pain, blood in stool, constipation, diarrhea and nausea.  Genitourinary: Negative for dysuria, frequency and hematuria.  Musculoskeletal: Negative for arthralgias, joint swelling, myalgias and neck stiffness.  Skin: Negative for color change, pallor and rash.  Neurological: Positive for facial asymmetry. Negative for dizziness, seizures, speech difficulty, light-headedness, numbness and headaches.  Hematological: Negative for adenopathy. Does not bruise/bleed easily.       Objective:   Physical Exam  Vitals:   04/18/17 0913  BP: 102/78  Pulse: 85  SpO2: 97%  Weight: 126 lb (57.2 kg)  Height: 5' (1.524 m)    Gen: well appearing, no acute distress HENT: NCAT, OP clear, neck  supple without masses Eyes: PERRL, EOMi Lymph: no cervical lymphadenopathy PULM: Wheezing noted RUL CV: RRR, no mgr, no JVD GI: BS+, soft, nontender, no hsm Derm: no rash or skin breakdown MSK: normal bulk and tone Neuro: A&Ox4, CN II-XII intact, strength 5/5 in all 4 extremities Psyche: normal mood and affect   CBC Labs (Brief)          Component Value Date/Time   WBC 7.2 08/29/2016 1325   RBC 4.27 08/29/2016 1325   HGB 12.6 08/29/2016 1325   HGB 13.5 03/30/2009 1418   HCT 37.8 08/29/2016 1325   HCT 40.1 03/30/2009 1418   PLT 273 08/29/2016 1325   PLT 230 03/30/2009 1418   MCV 88.5 08/29/2016 1325   MCV 91.1 03/30/2009 1418  MCH 29.5 08/29/2016 1325   MCHC 33.3 08/29/2016 1325   RDW 12.2 08/29/2016 1325   RDW 12.0 03/30/2009 1418   LYMPHSABS 2.2 08/29/2016 1325   LYMPHSABS 2.5 03/30/2009 1418   MONOABS 0.4 08/29/2016 1325   MONOABS 0.4 03/30/2009 1418   EOSABS 0.2 08/29/2016 1325   EOSABS 0.1 03/30/2009 1418   BASOSABS 0.0 08/29/2016 1325   BASOSABS 0.1 03/30/2009 1418     BMET Labs (Brief)    Chest imaging: April 08, 2017 chest x-ray images independently reviewed showing normal pulmonary parenchyma  Records reviewed: No recent visits with our health system though in March 2018 she saw Dr. Jeraldine LootsLockwood while in the emergency room for abdominal pain.  She had a CT scan that showed no evidence of an acute abnormality.  Exhaled nitric oxide testing April 19, 1999 1820 ppm  Simple spirometry: 04/2017 Ratio 78 FEV 2.25L 97      Assessment & Plan:    Cough - Plan: Spirometry with Graph, Nitric oxide  Gastroesophageal reflux disease, esophagitis presence not specified  Allergic rhinitis due to pollen, unspecified seasonality  Discussion: Persistent off and on for several years.  This is with a background of allergic rhinitis with sounds as if it is only partially controlled.  Considering the dyspnea she has experienced in the  last several months which is intermittent and the wheezing I heard on exam today I am concerned about the possibility of asthma.  Recent chest x-ray showed no evidence of other pulmonary parenchymal abnormalities.  Lung function testing today did not show significant airflow obstruction and her exhaled nitric oxide test was borderline.  However, considering her clinical history of allergies and the wheezing on exam and course of treatment with an inhaled corticosteroid to see if this helps with her cough.  In addition, she has gastroesophageal reflux disease which is likely only partially controlled and significantly contributing to her cough.  Allergic rhinitis also is contributing as well.  Plan: Allergic rhinitis: Use Neil Med rinses with distilled water at least twice per day using the instructions on the package. 1/2 hour after using the [facility], use Nasacort two puffs in each nostril once per day.  Remember that the Nasacort can take 1-2 weeks to work after regular use. Use generic zyrtec (cetirizine) every day.  If this doesn't help, then stop taking it and use chlorpheniramine-phenylephrine combination tablets.  GERD: Follow the lifestyle modification sheet we gave you  Cough: Your asthma tests were normal but because of wheezing I think he will benefit from an inhaled steroid Try taking Asmanex 1 puff in the morning, 1 puff at night   Follow-up with us in 3-4 weeks, nurse practitioner visit okay   Current Outpatient Medications:  .  cetirizine (ZYRTEC) 10 MG tablet, Take 10 mg by mouth See admin instructions. Alternates taking with Claritin every other week but only takes every other day, Disp: , Rfl:  .  loratadine (CLARITIN) 10 MG tablet, Take 10 mg by mouth See admin instructions. Alternates taking with Cetirizine every other week but only takes every other day, Disp: , Rfl:  .  traMADol (ULTRAM) 50 MG tablet, Take 1 tablet (50 mg total) by mouth every 6 (six) hours as  needed., Disp: 15 tablet, Rfl: 0

## 2017-04-18 NOTE — Patient Instructions (Addendum)
Allergic rhinitis: Use Neil Med rinses with distilled water at least twice per day using the instructions on the package. 1/2 hour after using the [facility] rinse, use Nasacort two puffs in each nostril once per day.  Remember that the Nasacort can take 1-2 weeks to work after regular use. Use generic zyrtec (cetirizine) every day.  If this doesn't help, then stop taking it and use chlorpheniramine-phenylephrine combination tablets.  GERD: Follow the lifestyle modification sheet we gave you  Cough: Your asthma tests were normal but because of wheezing I think he will benefit from an inhaled steroid Try taking Asmanex 1 puff in the morning, 1 puff at night   Follow-up with us in 3-4 weeks, nurse practitioner visit okay

## 2017-05-14 ENCOUNTER — Encounter: Payer: Self-pay | Admitting: Pulmonary Disease

## 2017-05-14 ENCOUNTER — Ambulatory Visit: Payer: BLUE CROSS/BLUE SHIELD | Admitting: Pulmonary Disease

## 2017-05-14 VITALS — BP 106/64 | HR 72 | Ht 60.0 in | Wt 129.0 lb

## 2017-05-14 DIAGNOSIS — J301 Allergic rhinitis due to pollen: Secondary | ICD-10-CM

## 2017-05-14 DIAGNOSIS — J4530 Mild persistent asthma, uncomplicated: Secondary | ICD-10-CM

## 2017-05-14 DIAGNOSIS — K219 Gastro-esophageal reflux disease without esophagitis: Secondary | ICD-10-CM | POA: Diagnosis not present

## 2017-05-14 MED ORDER — MONTELUKAST SODIUM 10 MG PO TABS: 10.0000 mg | ORAL_TABLET | Freq: Every day | ORAL | 3 refills | Status: DC

## 2017-05-14 MED ORDER — ALBUTEROL SULFATE HFA 108 (90 BASE) MCG/ACT IN AERS: 2.0000 | INHALATION_SPRAY | Freq: Four times a day (QID) | RESPIRATORY_TRACT | 6 refills | Status: AC | PRN

## 2017-05-14 MED ORDER — MOMETASONE FUROATE 220 MCG/INH IN AEPB: 2.0000 | INHALATION_SPRAY | Freq: Two times a day (BID) | RESPIRATORY_TRACT | 12 refills | Status: DC

## 2017-05-14 NOTE — Progress Notes (Signed)
Subjective:    Patient ID: Ashley Farley, female    DOB: Dec 12, 1980, 36 y.o.   MRN: [id number]  Synopsis: Referred in 2018 for upper airway cough syndrome in the setting of likely cough variant asthma and allergic rhinitis.  HPI Chief Complaint  Patient presents with  . Follow-up   Ashley RamusDao had bad cold a week ago and has had more cough since then. She says taht her home was being renovated at the time.  She was coughing up more mucus, had a fever and chills.    Since the last visit she has had a few "attacks" of asthma when she could not breathe suddenly after being exposed to some cigarette smoke.  She says taht she had the sudden onset of dyspnea and chest tightness.    She feels a little better now.  She still has some throat and sinus congestion.  She still has some mucus running down the back o fher throat.  This is left over from last week.    Prior to her cold she had improvement in her cough.    Past Medical History:  Diagnosis Date  . Carpal tunnel syndrome   . Endometriosis   . Interstitial cystitis   . Kidney stones   . Sciatica   . Tendonitis of wrist, left   . Vitamin D deficiency          Review of Systems  Constitutional: Negative for appetite change, chills, diaphoresis, fatigue and fever.  HENT: Positive for rhinorrhea and sinus pressure. Negative for congestion, hearing loss, nosebleeds, postnasal drip, sore throat and trouble swallowing.   Respiratory: Positive for cough, shortness of breath and wheezing. Negative for choking and chest tightness.   Cardiovascular: Negative for chest pain and leg swelling.       Objective:   Physical Exam  Vitals:   05/14/17 0942  BP: 106/64  Pulse: 72  SpO2: 94%  Weight: 129 lb (58.5 kg)  Height: 5' (1.524 m)    Gen: well appearing HENT: OP clear, TM's clear, neck supple PULM: CTA B, normal percussion CV: RRR, no mgr, trace edema GI: BS+, soft, nontender Derm: no cyanosis or rash Psyche: normal mood and  affect    CBC    Component Value Date/Time   WBC 7.2 08/29/2016 1325   RBC 4.27 08/29/2016 1325   HGB 12.6 08/29/2016 1325   HGB 13.5 03/30/2009 1418   HCT 37.8 08/29/2016 1325   HCT 40.1 03/30/2009 1418   PLT 273 08/29/2016 1325   PLT 230 03/30/2009 1418   MCV 88.5 08/29/2016 1325   MCV 91.1 03/30/2009 1418   MCH 29.5 08/29/2016 1325   MCHC 33.3 08/29/2016 1325   RDW 12.2 08/29/2016 1325   RDW 12.0 03/30/2009 1418   LYMPHSABS 2.2 08/29/2016 1325   LYMPHSABS 2.5 03/30/2009 1418   MONOABS 0.4 08/29/2016 1325   MONOABS 0.4 03/30/2009 1418   EOSABS 0.2 08/29/2016 1325   EOSABS 0.1 03/30/2009 1418   BASOSABS 0.0 08/29/2016 1325   BASOSABS 0.1 03/30/2009 1418       Chest imaging: April 08, 2017 chest x-ray images independently reviewed showing normal pulmonary parenchyma  Records reviewed: No recent visits with our health system though in March 2018 she saw Dr. Jeraldine LootsLockwood while in the emergency room for abdominal pain.  She had a CT scan that showed no evidence of an acute abnormality.  Exhaled nitric oxide testing April 18, 2017 20 ppm  Simple spirometry: 04/2017 Ratio 78 FEV 2.25L 97  Assessment & Plan:    Gastroesophageal reflux disease, esophagitis presence not specified  Allergic rhinitis due to pollen, unspecified seasonality  Mild persistent asthma without complication  Discussion: She presents with what she feels are escalating symptoms of asthma and that she said 2 separate episodes where she had difficulty breathing after being exposed to smoke.  She said that she took Asmanex and it helped.  Unfortunately she is not using prescribed, she is only using on an as-needed basis.  We spent a significant amount of time today reviewing the difference between controller medicines and rescue medicines.  At this point she does seem to have poorly controlled asthma Eckley mild persistent disease.  I told her that we need to get her on regular controller  regimen for a month and then reassess her asthma control.  A significant portion of her symptoms are related to upper airway cough from persistent allergic rhinitis.  We will add Singulair today and stressed the importance of compliance to her allergic rhinitis regimen.  On the next visit if she still having symptoms we will assess whether or not we need to get repeat objective measurement for worsening asthma versus empiric treatment with acid.  Plan: Mild persistent asthma: Take Asmanex 2 puffs twice a day no matter how you feel Use albuterol 2 puffs every 4-6 hours as needed for chest tightness wheezing or shortness of breath Take Singulair daily  Allergic rhinitis: Take Zyrtec daily Take the triamcinolone nasal spray 2 puffs each nostril daily Take Singulair daily  Recent viral illness with acute congestion and sinuses: Use over-the-counter Neomed rinses Use over-the-counter phenylephrine spray  Return to clinic in 4 weeks after taking all these medicines routinely.  If your symptoms are not well controlled at that point will consider either empiric antacid therapy or repeating spirometry and exhaled nitric oxide test to see if you need better asthma control.   Current Outpatient Medications:  .  cetirizine (ZYRTEC) 10 MG tablet, Take 10 mg by mouth See admin instructions. Alternates taking with Claritin every other week but only takes every other day, Disp: , Rfl:  .  mometasone (ASMANEX) 220 MCG/INH inhaler, Inhale 2 puffs into the lungs daily., Disp: , Rfl:  .  traMADol (ULTRAM) 50 MG tablet, Take 1 tablet (50 mg total) by mouth every 6 (six) hours as needed., Disp: 15 tablet, Rfl: 0 .  triamcinolone (NASACORT) 55 MCG/ACT AERO nasal inhaler, Place 2 sprays daily into the nose., Disp: 1 Inhaler, Rfl: 12 .  loratadine (CLARITIN) 10 MG tablet, Take 10 mg by mouth See admin instructions. Alternates taking with Cetirizine every other week but only takes every other day, Disp: , Rfl:

## 2017-05-14 NOTE — Patient Instructions (Signed)
Mild persistent asthma: Take Asmanex 2 puffs twice a day no matter how you feel Use albuterol 2 puffs every 4-6 hours as needed for chest tightness wheezing or shortness of breath Take Singulair daily  Allergic rhinitis: Take Zyrtec daily Take the triamcinolone nasal spray 2 puffs each nostril daily Take Singulair daily  Recent viral illness with acute congestion and sinuses: Use over-the-counter Neomed rinses Use over-the-counter phenylephrine spray  Return to clinic in 4 weeks after taking all these medicines routinely.  If your symptoms are not well controlled at that point will consider either empiric antacid therapy or repeating spirometry and exhaled nitric oxide test to see if you need better asthma control.

## 2017-05-15 ENCOUNTER — Other Ambulatory Visit: Payer: Self-pay | Admitting: Pulmonary Disease

## 2017-05-15 MED ORDER — MOMETASONE FUROATE 220 MCG/INH IN AEPB: 2.0000 | INHALATION_SPRAY | Freq: Every day | RESPIRATORY_TRACT | 3 refills | Status: DC

## 2017-06-25 ENCOUNTER — Ambulatory Visit: Payer: BLUE CROSS/BLUE SHIELD | Admitting: Pulmonary Disease

## 2019-09-25 ENCOUNTER — Ambulatory Visit: Payer: BLUE CROSS/BLUE SHIELD

## 2019-09-25 ENCOUNTER — Ambulatory Visit: Payer: BLUE CROSS/BLUE SHIELD | Attending: Internal Medicine

## 2019-09-25 DIAGNOSIS — Z23 Encounter for immunization: Secondary | ICD-10-CM

## 2019-09-25 NOTE — Progress Notes (Signed)
   Covid-19 Vaccination Clinic  Name:  Ashley Farley    MRN: [id number] DOB: 1980-06-29  09/25/2019  [name] was observed post Covid-19 immunization for 15 minutes without incident. She was provided with Vaccine Information Sheet and instruction to access the V-Safe system.   [name] was instructed to call 911 with any severe reactions post vaccine: Marland Kitchen Difficulty breathing  . Swelling of face and throat  . A fast heartbeat  . A bad rash all over body  . Dizziness and weakness   Immunizations Administered    Name Date Dose VIS Date Route   Pfizer COVID-19 Vaccine 09/25/2019  2:18 PM 0.3 mL 05/22/2019 Intramuscular   Manufacturer: [employer], [employer]   Lot: [id number]   NDC: [address]-4

## 2019-10-19 ENCOUNTER — Ambulatory Visit: Payer: BLUE CROSS/BLUE SHIELD | Attending: Internal Medicine

## 2019-10-19 DIAGNOSIS — Z23 Encounter for immunization: Secondary | ICD-10-CM

## 2019-10-19 NOTE — Progress Notes (Signed)
   Covid-19 Vaccination Clinic  Name:  Ashley Farley    MRN: [id number] DOB: 1980-06-12  10/19/2019  [name] was observed post Covid-19 immunization for 15 minutes without incident. She was provided with Vaccine Information Sheet and instruction to access the V-Safe system.   [name] was instructed to call 911 with any severe reactions post vaccine: Marland Kitchen Difficulty breathing  . Swelling of face and throat  . A fast heartbeat  . A bad rash all over body  . Dizziness and weakness   Immunizations Administered    Name Date Dose VIS Date Route   Pfizer COVID-19 Vaccine 10/19/2019  2:02 PM 0.3 mL 08/05/2018 Intramuscular   Manufacturer: [employer], [employer]   Lot: [id number]   NDC: [address]-9

## 2020-05-14 ENCOUNTER — Ambulatory Visit: Payer: BLUE CROSS/BLUE SHIELD

## 2020-10-07 ENCOUNTER — Ambulatory Visit: Payer: Self-pay | Admitting: Surgery

## 2020-10-07 ENCOUNTER — Other Ambulatory Visit: Payer: Self-pay | Admitting: Surgery

## 2020-10-07 DIAGNOSIS — N6332 Unspecified lump in axillary tail of the left breast: Secondary | ICD-10-CM

## 2020-10-07 NOTE — H&P (Signed)
History of Present Illness Ashley Farley. Ashley Chiang MD; 10/07/2020 10:36 Farley) The patient is a 40 year old female who presents with a breast mass. PCP - Dr. Andria Frames Referred for left axillary mass/ tenderness  This is a 40 year old female in good health who presents with several years of asymmetric fullness in her left axilla. In 2017 she had a mammogram and ultrasound that showed normal-appearing breast tissue and an enlarged fat pad. She chose not to have anything done at that time. However over the last year, the mass has become larger and is causing significant pain. The pain is intermittent. The patient has a NuvaRing in place for endometriosis so she does not have normal periods. She has not had any further imaging in the last 5 years. No family history of breast cancer.   Problem List/Past Medical Ashley Farley K. Rudie Rikard, MD; 10/07/2020 10:37 Farley) BREAST MASS IN FEMALE (N63.0) LEFT AXILLARY FULLNESS (R22.32) LEFT AXILLARY PAIN (W09.811)  Past Surgical History Ashley Farley K. Corliss Skains, MD; 10/07/2020 10:37 Farley) Cesarean Section - 1 Gallbladder Surgery - Laparoscopic Oral Surgery  Diagnostic Studies History Ashley Farley. Paola Aleshire, MD; 10/07/2020 10:37 Farley) Mammogram never Pap Smear 1-5 years ago  Allergies Ashley Bickers, Farley; 02/23/7828 5:62 Farley) Amoxicillin ER *PENICILLINS* Vicodin *ANALGESICS - OPIOID* Nausea. Allergies Reconciled  Medication History Ashley Bickers, Farley; 07/11/8655 8:46 Farley) Albuterol Sulfate HFA (108 (90 Base)MCG/ACT Aerosol Soln, Inhalation) Active. Azelastine HCl (0.1% Solution, Nasal) Active. Vitamin D (Cholecalciferol) (10 MCG(400 UNIT) Tablet Chewable, Oral) Active. Vitamin C (500MG  Capsule, Oral) Active. ZyrTEC (10MG  Tablet, Oral) Active. Mirena (52 MG) (20MCG/24HR IUD, Intrauterine) Active. Medications Reconciled  Social History . Neco Kling, MD; 10/07/2020 10:37 Farley) Alcohol use Occasional alcohol use. Caffeine use Tea. No drug use Tobacco  use Never smoker.  Family History Ashley Farley. Dabria Wadas, MD; 10/07/2020 10:37 Farley) Arthritis Father, Mother. Bleeding disorder Sister. Cancer Father. Cerebrovascular Accident Father. Colon Polyps Sister. Diabetes Mellitus Father, Mother, Sister. Heart Disease Father, Mother, Sister. Heart disease in female family member before age 62 Heart disease in female family member before age 7 Hypertension Father, Mother. Ischemic Bowel Disease Mother. Kidney Disease Brother. Respiratory Condition Father. Thyroid problems Sister.  Pregnancy / Birth History 76. Tadeo Besecker, MD; 10/07/2020 10:37 Farley) Age at menarche 10 years. Contraceptive History Contraceptive implant. Gravida 5 Irregular periods Length (months) of breastfeeding 3-6 Maternal age 67-30 Para 2  Other Problems 10/09/2020. Baird Polinski, MD; 10/07/2020 10:37 Farley) Back Pain Bladder Problems Kidney Stone Transfusion history    Vitals Ashley Farley; Ashley Farley) 10/07/2020 9:55 Farley Weight: 130.6 lb Height: 60in Body Surface Area: 1.56 m Body Mass Index: 25.51 kg/m  Pulse: 87 (Regular)  BP: 120/74(Sitting, Left Arm, Standard)        Physical Exam 4:13 K. Olympia Adelsberger MD; 10/07/2020 10:38 Farley)  The physical exam findings are as follows: Note:Constitutional: WDWN in NAD, conversant, no obvious deformities; resting comfortably Eyes: Pupils equal, round; sclera anicteric; moist conjunctiva; no lid lag HENT: Oral mucosa moist; good dentition Neck: No masses palpated, trachea midline; no thyromegaly Lungs: CTA bilaterally; normal respiratory effort Breasts: Symmetric, no nipple changes, no nipple discharge, no axillary lymphadenopathy on either side. Obvious asymmetry with visible fullness in the left axilla compared to the right. This protruded slightly. It is tender to palpation. There are no overlying skin changes. The margins are indistinct. CV: Regular rate and rhythm; no murmurs;  extremities well-perfused with no edema Abd: +bowel sounds, soft, non-tender, no palpable organomegaly; no palpable hernias Musc: Normal gait; no apparent clubbing  or cyanosis in extremities Lymphatic: No palpable cervical or axillary lymphadenopathy Skin: Warm, dry; no sign of jaundice Psychiatric - alert and oriented x 4; calm mood and affect    Assessment & Plan Ashley Farley K. Elster Corbello MD; 10/07/2020 10:38 Farley)  LEFT AXILLARY FULLNESS (R22.32)   LEFT AXILLARY PAIN (J19.417)  Current Plans DIAGNOSTIC MAMMOGRAPHY, PRODUCING DIRECT DIGITAL IMAGE, BILATERAL, ALL VIEWS (G0204) ULTRASOUND, LEFT BREAST (40814) Note:We'll first obtain mammogram and ultrasound for screening and to determine whether there are any masses that are distinctly noticed in the left axilla.  If mammo/ [place] are normal, will plan excision of left axillary accessory breast tissue. The surgical procedure has been discussed with the patient. Potential risks, benefits, alternative treatments, and expected outcomes have been explained. All of the patient's questions at this time have been answered. The likelihood of reaching the patient's treatment goal is good. The patient understand the proposed surgical procedure and wishes to proceed.  Ashley Farley. Corliss Skains, MD, [facility]  General/ Trauma Surgery   10/07/2020 10:38 Farley

## 2020-10-20 ENCOUNTER — Ambulatory Visit
Admission: RE | Admit: 2020-10-20 | Discharge: 2020-10-20 | Disposition: A | Discharge status: Home / Self Care | Payer: BLUE CROSS/BLUE SHIELD | Source: Ambulatory Visit | Attending: Surgery | Admitting: Surgery

## 2020-10-20 ENCOUNTER — Other Ambulatory Visit: Payer: Self-pay

## 2020-10-20 DIAGNOSIS — N6332 Unspecified lump in axillary tail of the left breast: Secondary | ICD-10-CM

## 2020-11-17 ENCOUNTER — Other Ambulatory Visit: Payer: BLUE CROSS/BLUE SHIELD

## 2020-12-13 ENCOUNTER — Other Ambulatory Visit: Payer: Self-pay

## 2020-12-13 ENCOUNTER — Encounter (HOSPITAL_BASED_OUTPATIENT_CLINIC_OR_DEPARTMENT_OTHER): Payer: Self-pay | Admitting: Surgery

## 2020-12-19 NOTE — Progress Notes (Signed)

## 2020-12-20 ENCOUNTER — Other Ambulatory Visit: Payer: Self-pay

## 2020-12-20 ENCOUNTER — Ambulatory Visit (HOSPITAL_BASED_OUTPATIENT_CLINIC_OR_DEPARTMENT_OTHER): Payer: BLUE CROSS/BLUE SHIELD | Admitting: Anesthesiology

## 2020-12-20 ENCOUNTER — Encounter (HOSPITAL_BASED_OUTPATIENT_CLINIC_OR_DEPARTMENT_OTHER)
Admission: RE | Disposition: A | Discharge status: Home / Self Care | Payer: Self-pay | Source: Home / Self Care | Attending: Surgery

## 2020-12-20 ENCOUNTER — Ambulatory Visit (HOSPITAL_BASED_OUTPATIENT_CLINIC_OR_DEPARTMENT_OTHER)
Admission: RE | Admit: 2020-12-20 | Discharge: 2020-12-20 | Disposition: A | Discharge status: Home / Self Care | Payer: BLUE CROSS/BLUE SHIELD | Attending: Surgery | Admitting: Surgery

## 2020-12-20 ENCOUNTER — Encounter (HOSPITAL_BASED_OUTPATIENT_CLINIC_OR_DEPARTMENT_OTHER): Payer: Self-pay | Admitting: Surgery

## 2020-12-20 DIAGNOSIS — Q831 Accessory breast: Secondary | ICD-10-CM | POA: Insufficient documentation

## 2020-12-20 DIAGNOSIS — Z79899 Other long term (current) drug therapy: Secondary | ICD-10-CM | POA: Diagnosis not present

## 2020-12-20 HISTORY — PX: EXCISION OF BREAST LESION: SHX6676

## 2020-12-20 LAB — POCT PREGNANCY, URINE: Preg Test, Ur: NEGATIVE

## 2020-12-20 SURGERY — EXCISION OF BREAST LESION
Anesthesia: General | Site: Breast | Laterality: Left

## 2020-12-20 MED ORDER — TRAMADOL HCL 50 MG PO TABS: 50.0000 mg | ORAL_TABLET | Freq: Four times a day (QID) | ORAL | 0 refills | Status: DC | PRN

## 2020-12-20 MED ORDER — FENTANYL CITRATE (PF) 100 MCG/2ML IJ SOLN
INTRAMUSCULAR | Status: DC | PRN
  Administered 2020-12-20: 25 ug via INTRAVENOUS
  Administered 2020-12-20: 50 ug via INTRAVENOUS
  Administered 2020-12-20: 25 ug via INTRAVENOUS

## 2020-12-20 MED ORDER — AMISULPRIDE (ANTIEMETIC) 5 MG/2ML IV SOLN
INTRAVENOUS | Status: AC
  Filled 2020-12-20: qty 2

## 2020-12-20 MED ORDER — DEXAMETHASONE SODIUM PHOSPHATE 4 MG/ML IJ SOLN
INTRAMUSCULAR | Status: DC | PRN
  Administered 2020-12-20: 4 mg via INTRAVENOUS

## 2020-12-20 MED ORDER — CHLORHEXIDINE GLUCONATE CLOTH 2 % EX PADS: 6.0000 | MEDICATED_PAD | Freq: Once | CUTANEOUS | Status: DC

## 2020-12-20 MED ORDER — ACETAMINOPHEN 500 MG PO TABS
1000.0000 mg | ORAL_TABLET | ORAL | Status: AC
  Administered 2020-12-20: 1000 mg via ORAL

## 2020-12-20 MED ORDER — DEXAMETHASONE SODIUM PHOSPHATE 10 MG/ML IJ SOLN
INTRAMUSCULAR | Status: AC
  Filled 2020-12-20: qty 1

## 2020-12-20 MED ORDER — MIDAZOLAM HCL 5 MG/5ML IJ SOLN
INTRAMUSCULAR | Status: DC | PRN
  Administered 2020-12-20: 2 mg via INTRAVENOUS

## 2020-12-20 MED ORDER — ONDANSETRON HCL 4 MG/2ML IJ SOLN
INTRAMUSCULAR | Status: AC
  Filled 2020-12-20: qty 2

## 2020-12-20 MED ORDER — CEFAZOLIN SODIUM-DEXTROSE 2-4 GM/100ML-% IV SOLN
INTRAVENOUS | Status: AC
  Filled 2020-12-20: qty 100

## 2020-12-20 MED ORDER — MIDAZOLAM HCL 2 MG/2ML IJ SOLN
INTRAMUSCULAR | Status: AC
  Filled 2020-12-20: qty 2

## 2020-12-20 MED ORDER — MEPERIDINE HCL 25 MG/ML IJ SOLN: 6.2500 mg | INTRAMUSCULAR | Status: DC | PRN

## 2020-12-20 MED ORDER — AMISULPRIDE (ANTIEMETIC) 5 MG/2ML IV SOLN
10.0000 mg | Freq: Once | INTRAVENOUS | Status: AC | PRN
  Administered 2020-12-20: 10 mg via INTRAVENOUS

## 2020-12-20 MED ORDER — LACTATED RINGERS IV SOLN: INTRAVENOUS | Status: DC

## 2020-12-20 MED ORDER — ONDANSETRON HCL 4 MG/2ML IJ SOLN
INTRAMUSCULAR | Status: DC | PRN
  Administered 2020-12-20: 4 mg via INTRAVENOUS

## 2020-12-20 MED ORDER — FENTANYL CITRATE (PF) 100 MCG/2ML IJ SOLN
INTRAMUSCULAR | Status: AC
  Filled 2020-12-20: qty 2

## 2020-12-20 MED ORDER — LIDOCAINE 2% (20 MG/ML) 5 ML SYRINGE
INTRAMUSCULAR | Status: DC | PRN
  Administered 2020-12-20: 60 mg via INTRAVENOUS

## 2020-12-20 MED ORDER — PROPOFOL 10 MG/ML IV BOLUS
INTRAVENOUS | Status: DC | PRN
  Administered 2020-12-20: 200 mg via INTRAVENOUS

## 2020-12-20 MED ORDER — PROMETHAZINE HCL 25 MG/ML IJ SOLN
6.2500 mg | INTRAMUSCULAR | Status: DC | PRN
Start: 2020-12-20 — End: 2020-12-20

## 2020-12-20 MED ORDER — BUPIVACAINE-EPINEPHRINE 0.25% -1:200000 IJ SOLN
INTRAMUSCULAR | Status: DC | PRN
  Administered 2020-12-20: 10 mL

## 2020-12-20 MED ORDER — CEFAZOLIN SODIUM-DEXTROSE 2-4 GM/100ML-% IV SOLN
2.0000 g | INTRAVENOUS | Status: AC
  Administered 2020-12-20: 2 g via INTRAVENOUS

## 2020-12-20 MED ORDER — ACETAMINOPHEN 500 MG PO TABS
ORAL_TABLET | ORAL | Status: AC
  Filled 2020-12-20: qty 2

## 2020-12-20 MED ORDER — HYDROMORPHONE HCL 1 MG/ML IJ SOLN
INTRAMUSCULAR | Status: AC
  Filled 2020-12-20: qty 0.5

## 2020-12-20 MED ORDER — ONDANSETRON 4 MG PO TBDP: 4.0000 mg | ORAL_TABLET | Freq: Three times a day (TID) | ORAL | 0 refills | Status: DC | PRN

## 2020-12-20 MED ORDER — HYDROMORPHONE HCL 1 MG/ML IJ SOLN
0.2500 mg | INTRAMUSCULAR | Status: DC | PRN
  Administered 2020-12-20: 0.5 mg via INTRAVENOUS

## 2020-12-20 MED ORDER — EPHEDRINE SULFATE 50 MG/ML IJ SOLN
INTRAMUSCULAR | Status: DC | PRN
  Administered 2020-12-20: 10 mg via INTRAVENOUS

## 2020-12-20 SURGICAL SUPPLY — 48 items
APL PRP STRL LF DISP 70% ISPRP (MISCELLANEOUS) ×1
APL SKNCLS STERI-STRIP NONHPOA (GAUZE/BANDAGES/DRESSINGS) ×1
APL SWBSTK 6 STRL LF DISP (MISCELLANEOUS)
APPLICATOR COTTON TIP 6 STRL (MISCELLANEOUS) IMPLANT
APPLICATOR COTTON TIP 6IN STRL (MISCELLANEOUS)
APPLIER CLIP 9.375 MED OPEN (MISCELLANEOUS)
APR CLP MED 9.3 20 MLT OPN (MISCELLANEOUS)
BENZOIN TINCTURE PRP APPL 2/3 (GAUZE/BANDAGES/DRESSINGS) ×2 IMPLANT
BLADE HEX COATED 2.75 (ELECTRODE) ×2 IMPLANT
BLADE SURG 15 STRL LF DISP TIS (BLADE) ×1 IMPLANT
BLADE SURG 15 STRL SS (BLADE) ×2
CANISTER SUCT 1200ML W/VALVE (MISCELLANEOUS) ×2 IMPLANT
CHLORAPREP W/TINT 26 (MISCELLANEOUS) ×2 IMPLANT
CLIP APPLIE 9.375 MED OPEN (MISCELLANEOUS) IMPLANT
COVER BACK TABLE 60X90IN (DRAPES) ×2 IMPLANT
COVER MAYO STAND STRL (DRAPES) ×2 IMPLANT
DECANTER SPIKE VIAL GLASS SM (MISCELLANEOUS) IMPLANT
DRAPE LAPAROTOMY 100X72 PEDS (DRAPES) ×2 IMPLANT
DRAPE UTILITY XL STRL (DRAPES) ×2 IMPLANT
DRSG TEGADERM 4X4.75 (GAUZE/BANDAGES/DRESSINGS) ×2 IMPLANT
ELECT REM PT RETURN 9FT ADLT (ELECTROSURGICAL) ×2
ELECTRODE REM PT RTRN 9FT ADLT (ELECTROSURGICAL) ×1 IMPLANT
GAUZE SPONGE 4X4 12PLY STRL LF (GAUZE/BANDAGES/DRESSINGS) ×2 IMPLANT
GLOVE SURG ENC MOIS LTX SZ6.5 (GLOVE) ×1 IMPLANT
GLOVE SURG ENC MOIS LTX SZ7 (GLOVE) ×2 IMPLANT
GLOVE SURG UNDER POLY LF SZ7 (GLOVE) ×2 IMPLANT
GLOVE SURG UNDER POLY LF SZ7.5 (GLOVE) ×2 IMPLANT
GOWN STRL REUS W/ TWL LRG LVL3 (GOWN DISPOSABLE) ×2 IMPLANT
GOWN STRL REUS W/TWL LRG LVL3 (GOWN DISPOSABLE) ×4
KIT MARKER MARGIN INK (KITS) IMPLANT
NDL HYPO 25X1 1.5 SAFETY (NEEDLE) ×1 IMPLANT
NEEDLE HYPO 25X1 1.5 SAFETY (NEEDLE) ×2 IMPLANT
NS IRRIG 1000ML POUR BTL (IV SOLUTION) ×2 IMPLANT
PACK BASIN DAY SURGERY FS (CUSTOM PROCEDURE TRAY) ×2 IMPLANT
PENCIL SMOKE EVACUATOR (MISCELLANEOUS) ×2 IMPLANT
SLEEVE SCD COMPRESS KNEE MED (STOCKING) ×2 IMPLANT
SPONGE T-LAP 4X18 ~~LOC~~+RFID (SPONGE) ×2 IMPLANT
STRIP CLOSURE SKIN 1/2X4 (GAUZE/BANDAGES/DRESSINGS) ×2 IMPLANT
SUT CHROMIC 3 0 SH 27 (SUTURE) IMPLANT
SUT MON AB 4-0 PC3 18 (SUTURE) ×2 IMPLANT
SUT SILK 2 0 SH (SUTURE) IMPLANT
SUT VIC AB 3-0 SH 27 (SUTURE) ×2
SUT VIC AB 3-0 SH 27X BRD (SUTURE) ×1 IMPLANT
SYR CONTROL 10ML LL (SYRINGE) ×2 IMPLANT
TOWEL GREEN STERILE FF (TOWEL DISPOSABLE) ×2 IMPLANT
TRAY FAXITRON CT DISP (TRAY / TRAY PROCEDURE) IMPLANT
TUBE CONNECTING 20X1/4 (TUBING) ×2 IMPLANT
YANKAUER SUCT BULB TIP NO VENT (SUCTIONS) ×2 IMPLANT

## 2020-12-20 NOTE — H&P (Signed)
History of Present Illness  The patient is a 40 year old female who presents with a breast mass. PCP - Dr. Andria Frames Referred for left axillary mass/ tenderness   This is a 40 year old female in good health who presents with several years of asymmetric fullness in her left axilla.  In 2017 she had a mammogram and ultrasound that showed normal-appearing breast tissue and an enlarged fat pad.  She chose not to have anything done at that time.  However over the last year, the mass has become larger and is causing significant pain.  The pain is intermittent.  The patient has a NuvaRing in place for endometriosis so she does not have normal periods.  She has not had any further imaging in the last 5 years.  No family history of breast cancer.     Problem List/Past Medical  BREAST MASS IN FEMALE (N63.0)  LEFT AXILLARY FULLNESS (R22.32)  LEFT AXILLARY PAIN (B09.628)    Past Surgical History Cesarean Section - 1  Gallbladder Surgery - Laparoscopic  Oral Surgery    Diagnostic Studies History  Mammogram  never Pap Smear  1-5 years ago   Allergies  Amoxicillin ER *PENICILLINS*  Vicodin *ANALGESICS - OPIOID*  Nausea. Allergies Reconciled    Medication History  Albuterol Sulfate HFA  (108 (90 Base)MCG/ACT Aerosol Soln, Inhalation) Active. Azelastine HCl  (0.1% Solution, Nasal) Active. Vitamin D (Cholecalciferol)  (10 MCG(400 UNIT) Tablet Chewable, Oral) Active. Vitamin C  (500MG  Capsule, Oral) Active. ZyrTEC  (10MG  Tablet, Oral) Active. Mirena (52 MG)  (20MCG/24HR IUD, Intrauterine) Active. Medications Reconciled    Social History  Alcohol use  Occasional alcohol use. Caffeine use  Tea. No drug use  Tobacco use  Never smoker.   Family History  Arthritis  Father, Mother. Bleeding disorder  Sister. Cancer  Father. Cerebrovascular Accident  Father. Colon Polyps  Sister. Diabetes Mellitus  Father, Mother, Sister. Heart Disease  Father, Mother, Sister. Heart disease in female  family member before age 40  Heart disease in female family member before age 49  Hypertension  Father, Mother. Ischemic Bowel Disease  Mother. Kidney Disease  Brother. Respiratory Condition  Father. Thyroid problems  Sister.   Pregnancy / Birth History  Age at menarche  10 years. Contraceptive History  Contraceptive implant. Gravida  5 Irregular periods  Length (months) of breastfeeding  3-6 Maternal age  42-30 Para  2   Other Problems  Back Pain  Bladder Problems  Kidney Stone  Transfusion history        Vitals  Weight: 130.6 lb   Height: 60 in  Body Surface Area: 1.56 m   Body Mass Index: 25.51 kg/m   Pulse: 87 (Regular)    BP: 120/74(Sitting, Left Arm, Standard)               Physical Exam    The physical exam findings are as follows: Note: Constitutional: WDWN in NAD, conversant, no obvious deformities; resting comfortably Eyes: Pupils equal, round; sclera anicteric; moist conjunctiva; no lid lag HENT: Oral mucosa moist; good dentition Neck: No masses palpated, trachea midline; no thyromegaly Lungs: CTA bilaterally; normal respiratory effort Breasts: Symmetric, no nipple changes, no nipple discharge, no axillary lymphadenopathy on either side. Obvious asymmetry with visible fullness in the left axilla compared to the right. This protruded slightly. It is tender to palpation. There are no overlying skin changes. The margins are indistinct. CV: Regular rate and rhythm; no murmurs; extremities well-perfused with no edema Abd: +bowel sounds, soft, non-tender,  no palpable organomegaly; no palpable hernias Musc: Normal gait; no apparent clubbing or cyanosis in extremities Lymphatic: No palpable cervical or axillary lymphadenopathy Skin: Warm, dry; no sign of jaundice Psychiatric - alert and oriented x 4; calm mood and affect       Assessment & Plan    LEFT AXILLARY FULLNESS (R22.32)     LEFT AXILLARY PAIN (A07.622)   Current Plans Excision of left  axillary accessory breast tissue.  The surgical procedure has been discussed with the patient.  Potential risks, benefits, alternative treatments, and expected outcomes have been explained.  All of the patient's questions at this time have been answered.  The likelihood of reaching the patient's treatment goal is good.  The patient understand the proposed surgical procedure and wishes to proceed.    Wilmon Arms. Corliss Skains, MD, [facility]  General Surgery   12/20/2020 8:10 AM

## 2020-12-20 NOTE — Addendum Note (Signed)
Addendum  created 12/20/20 1052 by Burna Cash, CRNA   Intraprocedure Event deleted, Intraprocedure Event edited

## 2020-12-20 NOTE — Transfer of Care (Signed)
Immediate Anesthesia Transfer of Care Note  Patient: Ashley Farley  Procedure(s) Performed: EXCISION OF LEFT AXILLARY ACCESORY BREAST TISSUE (Left: Breast)  Patient Location: PACU  Anesthesia Type:General  Level of Consciousness: sedated  Airway & Oxygen Therapy: Patient Spontanous Breathing and Patient connected to face mask oxygen  Post-op Assessment: Report given to RN and Post -op Vital signs reviewed and stable  Post vital signs: Reviewed and stable  Last Vitals:  Vitals Value Taken Time  BP 99/55 12/20/20 0925  Temp    Pulse 76 12/20/20 0927  Resp 20 12/20/20 0927  SpO2 98 % 12/20/20 0927  Vitals shown include unvalidated device data.  Last Pain:  Vitals:   12/20/20 0709  TempSrc: Oral  PainSc: 1       Patients Stated Pain Goal: 1 (12/20/20 0709)  Complications: No notable events documented.

## 2020-12-20 NOTE — Op Note (Signed)
Preop diagnosis: Accessory breast tissue left axilla Postop diagnosis: Same Procedure performed: Excision of accessory breast tissue left axilla Surgeon:Shaynah Hund K Moroni Nester Anesthesia: General Indications:This is a 40 year old female in good health who presents with several years of asymmetric fullness in her left axilla.  In 2017 she had a mammogram and ultrasound that showed normal-appearing breast tissue and an enlarged fat pad.  She chose not to have anything done at that time.  However over the last year, the mass has become larger and is causing significant pain.  The pain is intermittent.  The patient has a NuvaRing in place for endometriosis so she does not have normal periods.  She has not had any further imaging in the last 5 years.  No family history of breast cancer.  Description of procedure: The patient is brought to the operating room and placed in the supine position on the operating room table.  After an adequate level of general anesthesia was obtained, her axilla was prepped with ChloraPrep and draped in sterile fashion.  A timeout was taken to ensure the proper patient and proper procedure.  She has a protruding mass in her left axilla measuring approximately 4 x 3 cm.  We infiltrated with local anesthetic then I made a incision across the mass.  We dissected down in the subcutaneous tissue with cautery.  I excised the entire accessory breast tissue.  This included 1 or 2 lymph nodes 2.  This was sent for pathologic examination.  On the superior portion of the incision, as I was dissecting posterior to the dermis, 2 small buttonhole incisions were made with the cautery.  Both of these were repaired with 4-0 Monocryl.  The wound was closed with 3-0 Vicryl.  I used 3-0 Vicryl to tack the subcutaneous tissue down to the chest wall to hopefully prevent seroma formation.  4-0 Monocryl was used to close the skin.  Benzoin and Steri-Strips were applied.  The patient was then extubated and brought to  the recovery room in stable condition.  All sponge, instrument, and needle counts are correct.  Wilmon Arms. Corliss Skains, MD, [facility]  General Surgery   12/20/2020 9:26 AM

## 2020-12-20 NOTE — Discharge Instructions (Addendum)
[facility] Office Phone Number [phone]  POST OP INSTRUCTIONS  Always review your discharge instruction sheet given to you by the facility where your surgery was performed.  IF YOU HAVE DISABILITY OR FAMILY LEAVE FORMS, YOU MUST BRING THEM TO THE OFFICE FOR PROCESSING.  DO NOT GIVE THEM TO YOUR DOCTOR.  A prescription for pain medication may be given to you upon discharge.  Take your pain medication as prescribed, if needed.  If narcotic pain medicine is not needed, then you may take acetaminophen (Tylenol) or ibuprofen (Advil) as needed. Take your usually prescribed medications unless otherwise directed If you need a refill on your pain medication, please contact your pharmacy.  They will contact our office to request authorization.  Prescriptions will not be filled after 5pm or on week-ends. You should eat very light the first 24 hours after surgery, such as soup, crackers, pudding, etc.  Resume your normal diet the day after surgery. Most patients will experience some swelling and bruising in the axilla.  Ice packs will help.  Swelling and bruising can take several days to resolve.  It is common to experience some constipation if taking pain medication after surgery.  Increasing fluid intake and taking a stool softener will usually help or prevent this problem from occurring.  A mild laxative (Milk of Magnesia or Miralax) should be taken according to package directions if there are no bowel movements after 48 hours. Unless discharge instructions indicate otherwise, you may remove your bandages 24-48 hours after surgery, and you may shower at that time.  You may have steri-strips (small skin tapes) in place directly over the incision.  These strips should be left on the skin for 7-10 days.  ACTIVITIES:  You may resume regular daily activities (gradually increasing) beginning the next day.  Wearing a good support bra or sports bra minimizes pain and swelling.  You may have  sexual intercourse when it is comfortable. You may drive when you no longer are taking prescription pain medication, you can comfortably wear a seatbelt, and you can safely maneuver your car and apply brakes. RETURN TO WORK:  ______________________________________________________________________________________ Ashley Farley should see your doctor in the office for a follow-up appointment approximately two weeks after your surgery.  Your doctor's nurse will typically make your follow-up appointment when she calls you with your pathology report.  Expect your pathology report 2-3 business days after your surgery.  You may call to check if you do not hear from [place] after three days. OTHER INSTRUCTIONS: _______________________________________________________________________________________________ _____________________________________________________________________________________________________________________________________ _____________________________________________________________________________________________________________________________________ _____________________________________________________________________________________________________________________________________  WHEN TO CALL YOUR DOCTOR: Fever over 101.0 Nausea and/or vomiting. Extreme swelling or bruising. Continued bleeding from incision. Increased pain, redness, or drainage from the incision.  The clinic staff is available to answer your questions during regular business hours.  Please don't hesitate to call and ask to speak to one of the nurses for clinical concerns.  If you have a medical emergency, go to the nearest emergency room or call 911.  A surgeon from [facility] is always on call at the hospital.  For further questions, please visit [email or web address]     Post Anesthesia Home Care Instructions  Activity: Get plenty of rest for the remainder of the day. A responsible individual must stay with  you for 24 hours following the procedure.  For the next 24 hours, DO NOT: -Drive a car -Advertising copywriter -Drink alcoholic beverages -Take any medication unless instructed by your physician -Make any legal decisions or sign important papers.  Meals:  Start with liquid foods such as gelatin or soup. Progress to regular foods as tolerated. Avoid greasy, spicy, heavy foods. If nausea and/or vomiting occur, drink only clear liquids until the nausea and/or vomiting subsides. Call your physician if vomiting continues.  Special Instructions/Symptoms: Your throat may feel dry or sore from the anesthesia or the breathing tube placed in your throat during surgery. If this causes discomfort, gargle with warm salt water. The discomfort should disappear within 24 hours.  If you had a scopolamine patch placed behind your ear for the management of post- operative nausea and/or vomiting:  1. The medication in the patch is effective for 72 hours, after which it should be removed.  Wrap patch in a tissue and discard in the trash. Wash hands thoroughly with soap and water. 2. You may remove the patch earlier than 72 hours if you experience unpleasant side effects which may include dry mouth, dizziness or visual disturbances. 3. Avoid touching the patch. Wash your hands with soap and water after contact with the patch.   No tylenol today until after 1pm if needed.

## 2020-12-20 NOTE — Anesthesia Procedure Notes (Addendum)
Procedure Name: LMA Insertion Date/Time: 12/20/2020 8:45 AM Performed by: Burna Cash, CRNA Pre-anesthesia Checklist: Patient identified, Emergency Drugs available, Suction available and Patient being monitored Patient Re-evaluated:Patient Re-evaluated prior to induction Oxygen Delivery Method: Circle system utilized Preoxygenation: Pre-oxygenation with 100% oxygen Induction Type: IV induction Ventilation: Mask ventilation without difficulty LMA: LMA inserted LMA Size: 4.0 Number of attempts: 1 Airway Equipment and Method: Bite block Placement Confirmation: positive ETCO2 Tube secured with: Tape Dental Injury: Teeth and Oropharynx as per pre-operative assessment

## 2020-12-20 NOTE — Anesthesia Preprocedure Evaluation (Signed)
Anesthesia Evaluation  Patient identified by MRN, date of birth, ID band Patient awake    Reviewed: Allergy & Precautions, H&P , NPO status , Patient's Chart, lab work & pertinent test results, reviewed documented beta blocker date and time   Airway Mallampati: III  TM Distance: >3 FB Neck ROM: Full    Dental no notable dental hx. (+) Teeth Intact   Pulmonary neg pulmonary ROS,    Pulmonary exam normal breath sounds clear to auscultation       Cardiovascular hypertension, Pt. on medications Normal cardiovascular exam Rhythm:Regular Rate:Normal     Neuro/Psych negative neurological ROS  negative psych ROS   GI/Hepatic negative GI ROS, Neg liver ROS,   Endo/Other  negative endocrine ROS  Renal/GU   negative genitourinary   Musculoskeletal negative musculoskeletal ROS (+)   Abdominal (+) - obese,   Peds  Hematology Mild thrombocytopenia   Anesthesia Other Findings   Reproductive/Obstetrics (+) Pregnancy Di/Di twin gestation S/P IVF 34 43/7 weeks Severe pre eclampsia                             Anesthesia Physical  Anesthesia Plan  ASA: 2  Anesthesia Plan: General   Post-op Pain Management:    Induction: Intravenous  PONV Risk Score and Plan: 3 and Ondansetron, Dexamethasone, Midazolam and Treatment may vary due to age or medical condition  Airway Management Planned: LMA  Additional Equipment:   Intra-op Plan:   Post-operative Plan: Extubation in OR  Informed Consent: I have reviewed the patients History and Physical, chart, labs and discussed the procedure including the risks, benefits and alternatives for the proposed anesthesia with the patient or authorized representative who has indicated his/her understanding and acceptance.       Plan Discussed with: Anesthesiologist, CRNA and Surgeon  Anesthesia Plan Comments:         Anesthesia Quick Evaluation

## 2020-12-20 NOTE — Anesthesia Postprocedure Evaluation (Signed)
Anesthesia Post Note  Patient: Ashley Farley  Procedure(s) Performed: EXCISION OF LEFT AXILLARY ACCESORY BREAST TISSUE (Left: Breast)     Patient location during evaluation: PACU Anesthesia Type: General Level of consciousness: awake and alert Pain management: pain level controlled Vital Signs Assessment: post-procedure vital signs reviewed and stable Respiratory status: spontaneous breathing, nonlabored ventilation and respiratory function stable Cardiovascular status: blood pressure returned to baseline and stable Postop Assessment: no apparent nausea or vomiting Anesthetic complications: no   No notable events documented.  Last Vitals:  Vitals:   12/20/20 1030 12/20/20 1045  BP: 122/65   Pulse: 65 67  Resp: (!) 0 (!) 0  Temp:    SpO2: 99% 96%    Last Pain:  Vitals:   12/20/20 1030  TempSrc:   PainSc: 2                  Lowella Curb

## 2020-12-21 ENCOUNTER — Encounter (HOSPITAL_BASED_OUTPATIENT_CLINIC_OR_DEPARTMENT_OTHER): Payer: Self-pay | Admitting: Surgery

## 2020-12-21 LAB — SURGICAL PATHOLOGY

## 2021-06-21 ENCOUNTER — Other Ambulatory Visit: Payer: Self-pay | Admitting: Family Medicine

## 2021-06-21 DIAGNOSIS — N644 Mastodynia: Secondary | ICD-10-CM

## 2021-11-15 ENCOUNTER — Encounter (HOSPITAL_COMMUNITY): Payer: Self-pay | Admitting: Obstetrics and Gynecology

## 2022-01-11 IMAGING — MG DIGITAL DIAGNOSTIC BILAT W/ TOMO W/ CAD
6 of 10 series · 6 of 30 positions shown · non-contrast
Comparison: Previous exam(s).

CLINICAL DATA: 39-year-old female with a fatty left axillary lymph
and associated tenderness, enlarging and worsening over the last
couple years.

EXAM:
DIGITAL DIAGNOSTIC BILATERAL MAMMOGRAM WITH TOMOSYNTHESIS AND CAD;
US AXILLARY LEFT
TECHNIQUE: Bilateral digital diagnostic mammography and breast tomosynthesis
was performed. The images were evaluated with computer-aided
detection.; Targeted ultrasound examination of the left axilla was
performed.

[R CC synth-2D]
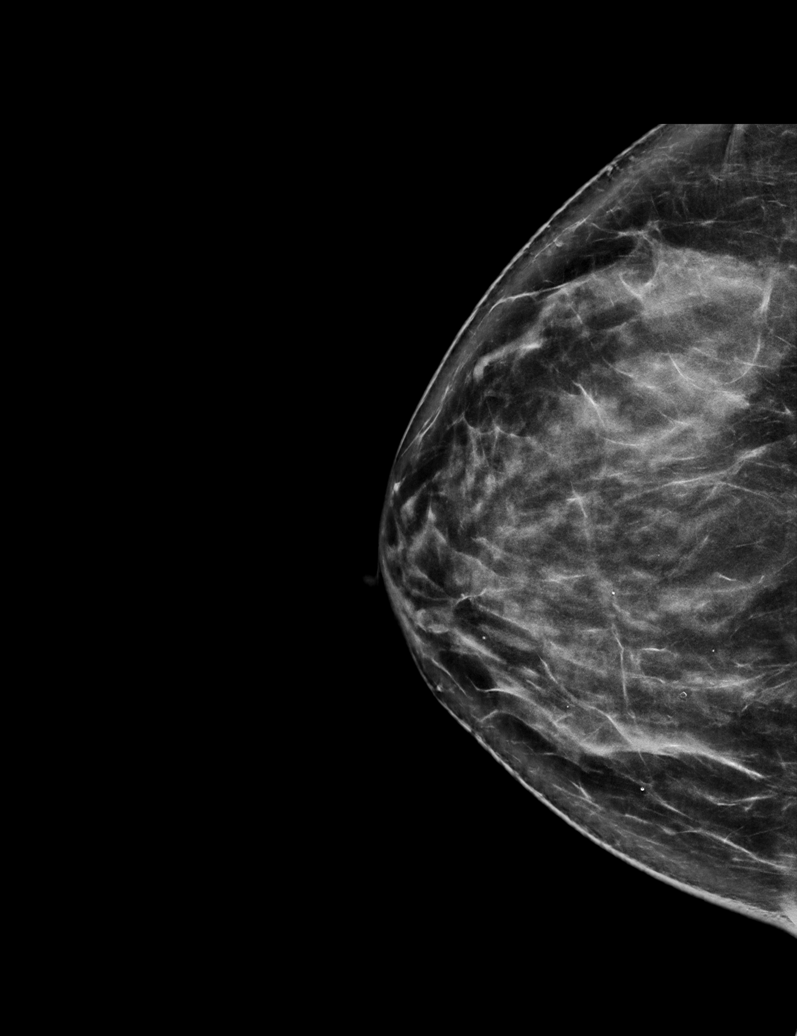

[L MLO synth-2D]
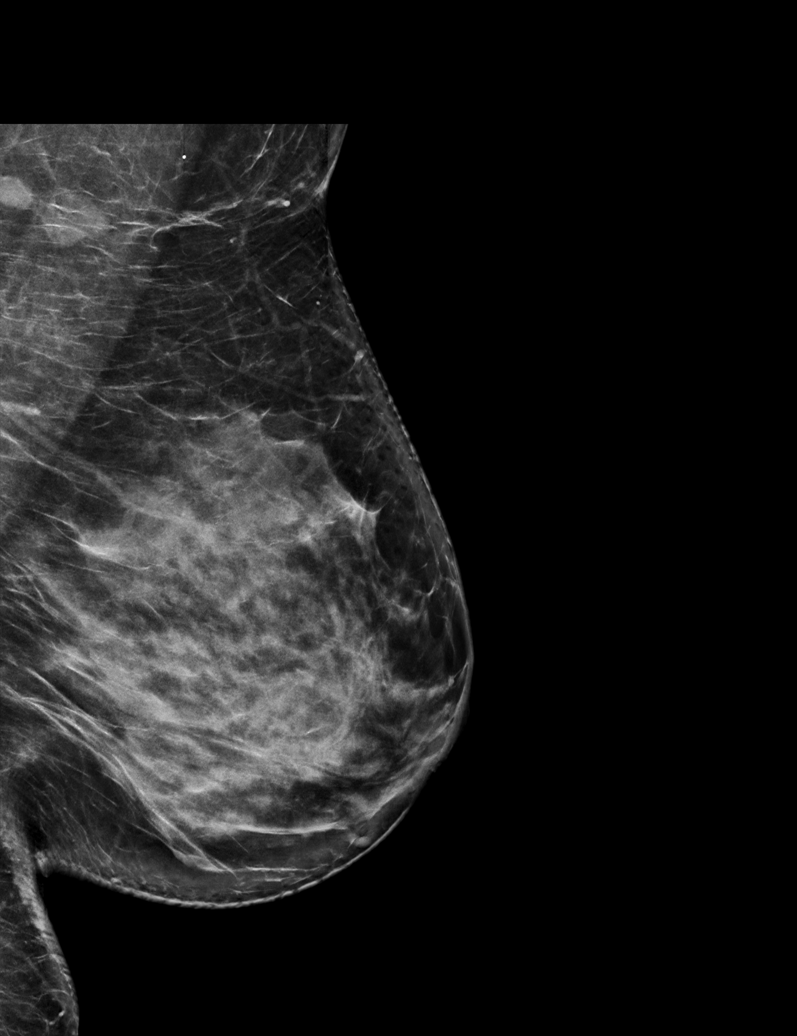

[R MLO synth-2D]
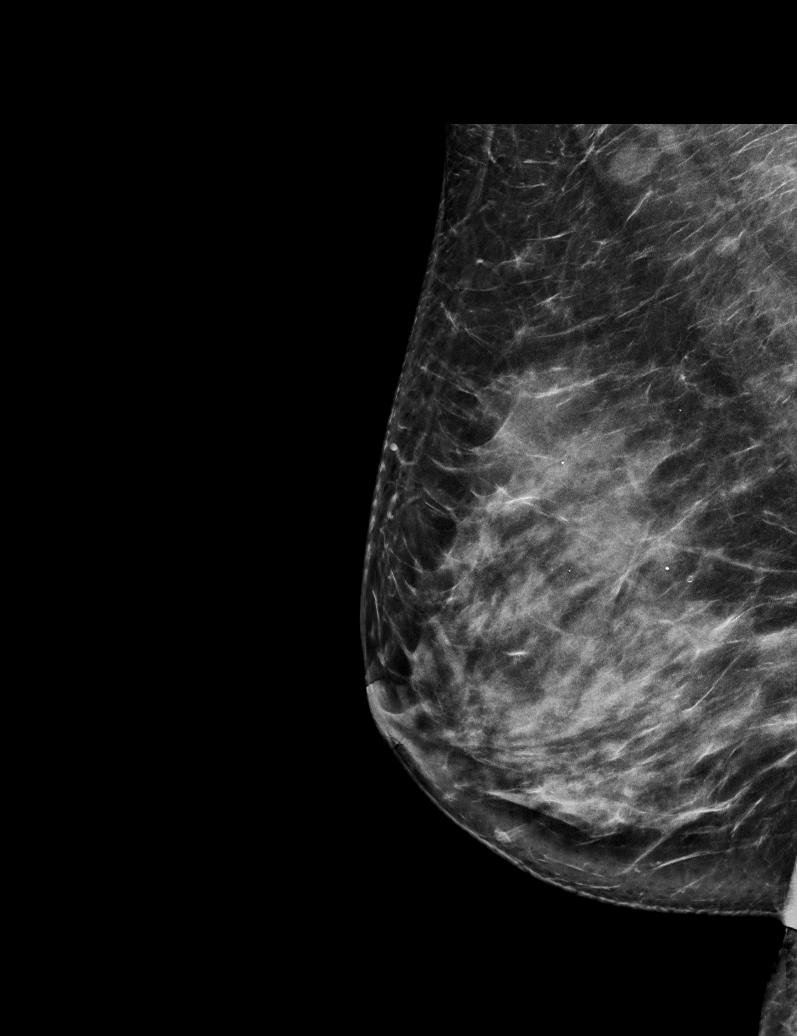

[L CC synth-2D]
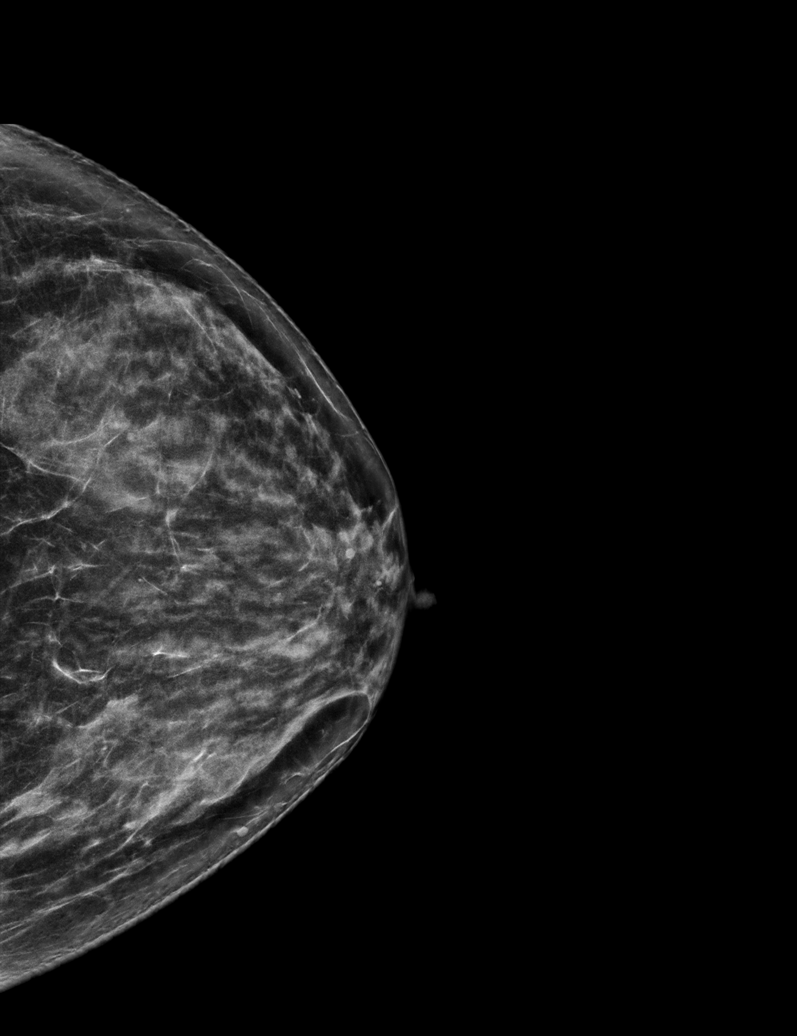

[L TAN synth-2D]
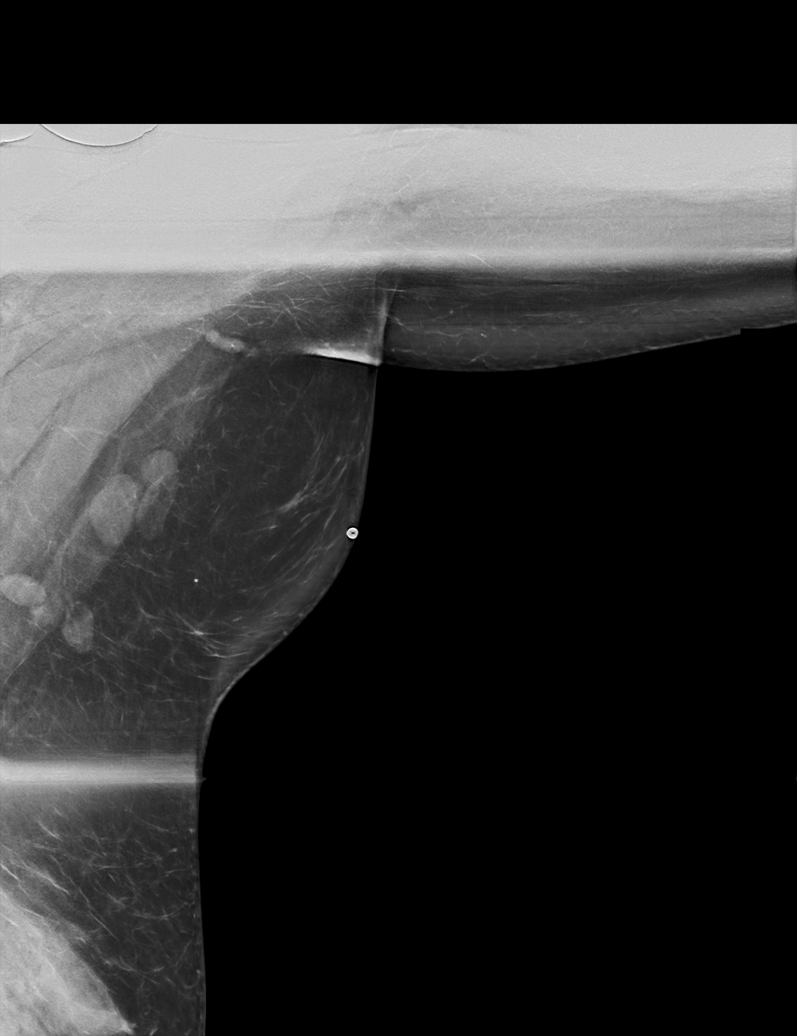

[R CC tomo · tomo slice 39/77.0]
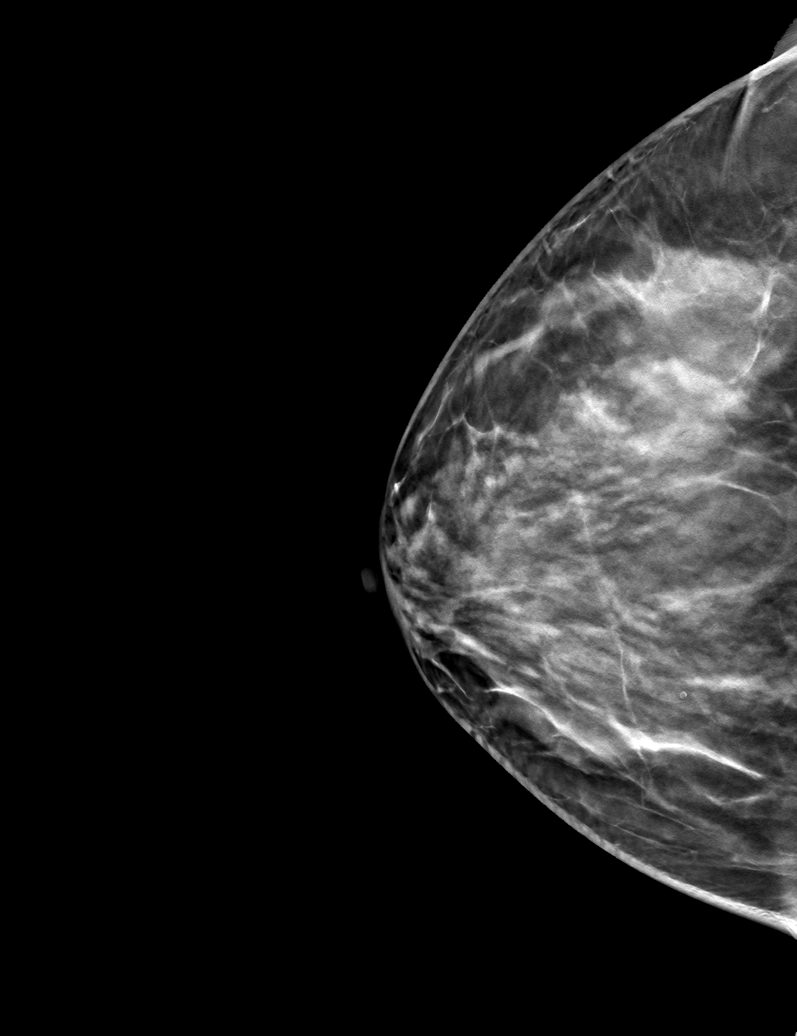

[6 of 30 positions shown; findings below may reference images not displayed]

ACR Breast Density Category c: The breast tissue is heterogeneously
dense, which may obscure small masses.
FINDINGS: No suspicious mammographic findings are identified in either breast.
The parenchymal pattern is stable. A prominent fat pad with a small
amount of fibroglandular tissue is again noted in the left axilla
corresponding with the radiopaque BB. No suspicious findings
identified.

Targeted ultrasound is performed, showing glandular tissue in a
prominent fat pad without focal or suspicious sonographic
abnormality.
IMPRESSION: 1. No mammographic evidence of malignancy in either breast.
2. No suspicious mammographic or sonographic findings in the left
axilla.

RECOMMENDATION:
1. Clinical follow-up recommended for the symptomatic area of
concern in the left axilla. Any further workup should be based on
clinical grounds.
2.  Screening mammogram in one year.(Code:[id number])

I have discussed the findings and recommendations with the patient.
If applicable, a reminder letter will be sent to the patient
regarding the next appointment.

BI-RADS CATEGORY  2: Benign.

## 2022-04-20 ENCOUNTER — Other Ambulatory Visit: Payer: Self-pay | Admitting: Urology

## 2022-04-26 ENCOUNTER — Ambulatory Visit (HOSPITAL_BASED_OUTPATIENT_CLINIC_OR_DEPARTMENT_OTHER): Admission: RE | Admit: 2022-04-26 | Payer: BLUE CROSS/BLUE SHIELD | Source: Home / Self Care | Admitting: Urology

## 2022-04-26 ENCOUNTER — Encounter (HOSPITAL_BASED_OUTPATIENT_CLINIC_OR_DEPARTMENT_OTHER): Admission: RE | Payer: Self-pay | Source: Home / Self Care

## 2022-04-26 SURGERY — LITHOTRIPSY, ESWL
Anesthesia: LOCAL | Laterality: Left

## 2022-10-18 LAB — HM PAP SMEAR: HM Pap smear: NEGATIVE

## 2023-07-09 ENCOUNTER — Ambulatory Visit: Payer: Self-pay | Admitting: Family Medicine

## 2023-07-09 NOTE — Telephone Encounter (Signed)
Copied from CRM [id number]. Topic: Clinical - Red Word Triage >> Jul 09, 2023  8:11 AM Ashley Farley wrote: Red Word that prompted transfer to Nurse Triage: shooting left side pain under ribs. Made pt a new pt appt but it is not until April   Chief Complaint: left side pain Symptoms: diarrhea past 2 days Frequency: ongoing for 7 days Pertinent Negatives: Patient denies vomiting or nausea Disposition: [] ED /[x] Urgent Care (no appt availability in office) / [] Appointment(In office/virtual)/ []  [place] [facility]/ [] Home Care/ [] Refused Recommended Disposition /[] [place] [facility]/ []  Follow-up with PCP Additional Notes: The patient reported left abdominal shooting pain ongoing for 7 days.  The pain is worse while sitting and moving.  Sitting with no movement is 6/10 and touch is 10/10.  She had diarrhea Sunday and Monday and she thought it was due to eating spicy food.  She denied nausea or vomiting.  She has a sibling with colon cancer and wanted to be seen as soon as possible.  Advised the patient to be seen at urgent care as she is a new patient and no acute appointments were available in an appropriate timeframe.  Reason for Disposition  [1] MILD-MODERATE pain AND [2] constant AND [3] present > 2 hours  Answer Assessment - Initial Assessment Questions 1. LOCATION: "Where does it hurt?"      From belly button over to left side  2. RADIATION: "Does the pain shoot anywhere else?" (e.g., chest, back)     No  3. ONSET: "When did the pain begin?" (e.g., minutes, hours or days ago)      7 days ago 4. SUDDEN: "Gradual or sudden onset?"     Sudden  5. PATTERN "Does the pain come and go, or is it constant?"    - If it comes and goes: "How long does it last?" "Do you have pain now?"     (Note: Comes and goes means the pain is intermittent. It goes away completely between bouts.)    - If constant: "Is it getting better, staying the same, or getting worse?"      (Note: Constant means the pain  never goes away completely; most serious pain is constant and gets worse.)      Constant  6. SEVERITY: "How bad is the pain?"  (e.g., Scale 1-10; mild, moderate, or severe)    - MILD (1-3): Doesn't interfere with normal activities, abdomen soft and not tender to touch..     - MODERATE (4-7): Interferes with normal activities or awakens from sleep, abdomen tender to touch.     - SEVERE (8-10): Excruciating pain, doubled over, unable to do any normal activities.       Sitting with movement 7-8/10 6/10 with no movement 10/10 with touch  7. RECURRENT SYMPTOM: "Have you ever had this type of stomach pain before?" If Yes, ask: "When was the last time?" and "What happened that time?"      No  8. AGGRAVATING FACTORS: "Does anything seem to cause this pain?" (e.g., foods, stress, alcohol)     Sitting with movement  9. CARDIAC SYMPTOMS: "Do you have any of the following symptoms: chest pain, difficulty breathing, sweating, nausea?" 10. OTHER SYMPTOMS: "Do you have any other symptoms?" (e.g., back pain, diarrhea, fever, urination pain, vomiting)           Diarrhea for past 2 days but have eaten spicy food   11. PREGNANCY: "Is there any chance you are pregnant?" "When was your last menstrual  period?"       No - birth control ring  Protocols used: Abdominal Pain - Upper-A-AH

## 2023-08-28 ENCOUNTER — Telehealth (HOSPITAL_BASED_OUTPATIENT_CLINIC_OR_DEPARTMENT_OTHER): Payer: Self-pay | Admitting: *Deleted

## 2023-08-28 NOTE — Telephone Encounter (Signed)
 Copied from CRM [id number]. Topic: Appointments - Transfer of Care >> Aug 28, 2023 11:59 AM Antwanette L wrote: Pt is requesting to transfer FROM: Dr. Riley Nearing Pt is requesting to transfer TO: Jerre Simon Reason for requested transfer: The provider is too far and her appointments kept getting cancelled It is the responsibility of the team the patient would like to transfer to [facility] FNP-C) to reach out to the patient if for any reason this transfer is not acceptable.   Patient has an upcoming appt with new pcp. Appt can stay as scheduled.

## 2023-09-13 ENCOUNTER — Other Ambulatory Visit: Payer: Self-pay | Admitting: Obstetrics and Gynecology

## 2023-09-13 DIAGNOSIS — Z1231 Encounter for screening mammogram for malignant neoplasm of breast: Secondary | ICD-10-CM

## 2023-09-20 ENCOUNTER — Ambulatory Visit: Payer: BLUE CROSS/BLUE SHIELD | Admitting: Family Medicine

## 2023-10-22 ENCOUNTER — Encounter (HOSPITAL_BASED_OUTPATIENT_CLINIC_OR_DEPARTMENT_OTHER): Payer: Self-pay | Admitting: Family Medicine

## 2023-10-22 ENCOUNTER — Encounter (HOSPITAL_BASED_OUTPATIENT_CLINIC_OR_DEPARTMENT_OTHER): Payer: Self-pay | Admitting: *Deleted

## 2023-10-22 ENCOUNTER — Ambulatory Visit (HOSPITAL_BASED_OUTPATIENT_CLINIC_OR_DEPARTMENT_OTHER): Admitting: Family Medicine

## 2023-10-22 VITALS — BP 111/81 | HR 67 | Ht 60.0 in | Wt 138.2 lb

## 2023-10-22 DIAGNOSIS — R102 Pelvic and perineal pain: Secondary | ICD-10-CM | POA: Insufficient documentation

## 2023-10-22 DIAGNOSIS — E559 Vitamin D deficiency, unspecified: Secondary | ICD-10-CM

## 2023-10-22 DIAGNOSIS — R3129 Other microscopic hematuria: Secondary | ICD-10-CM

## 2023-10-22 DIAGNOSIS — E663 Overweight: Secondary | ICD-10-CM | POA: Insufficient documentation

## 2023-10-22 DIAGNOSIS — R768 Other specified abnormal immunological findings in serum: Secondary | ICD-10-CM | POA: Insufficient documentation

## 2023-10-22 DIAGNOSIS — O30049 Twin pregnancy, dichorionic/diamniotic, unspecified trimester: Secondary | ICD-10-CM | POA: Insufficient documentation

## 2023-10-22 DIAGNOSIS — O4400 Placenta previa specified as without hemorrhage, unspecified trimester: Secondary | ICD-10-CM | POA: Insufficient documentation

## 2023-10-22 DIAGNOSIS — O99810 Abnormal glucose complicating pregnancy: Secondary | ICD-10-CM | POA: Insufficient documentation

## 2023-10-22 DIAGNOSIS — N200 Calculus of kidney: Secondary | ICD-10-CM | POA: Insufficient documentation

## 2023-10-22 DIAGNOSIS — E782 Mixed hyperlipidemia: Secondary | ICD-10-CM | POA: Insufficient documentation

## 2023-10-22 LAB — POCT URINALYSIS DIP (CLINITEK)
Bilirubin, UA: NEGATIVE
Glucose, UA: NEGATIVE mg/dL
Ketones, POC UA: NEGATIVE mg/dL
Leukocytes, UA: NEGATIVE
Nitrite, UA: NEGATIVE
POC PROTEIN,UA: NEGATIVE
Spec Grav, UA: 1.025 (ref 1.010–1.025)
Urobilinogen, UA: 0.2 U/dL
pH, UA: 6.5 (ref 5.0–8.0)

## 2023-10-22 LAB — POCT URINE PREGNANCY: Preg Test, Ur: NEGATIVE

## 2023-10-22 NOTE — Patient Instructions (Signed)
 Please return for fasting blood work.  For fasting, if your blood work is in the morning please do not eat any food after midnight.  You may have water or black coffee prior to your lab work.  Please take all regularly prescribed medications even if you are fasting.  If your blood work is in the afternoon, please fast for at least 5 to 6 hours.  You may continue to drink water and/or black coffee prior to your lab work.  Please take all scheduled medications even if you are fasting.

## 2023-10-22 NOTE — Progress Notes (Signed)
 New Patient Office Visit  Subjective:   Ashley Farley 11-02-1980 10/22/2023  Chief Complaint  Patient presents with   New Patient (Initial Visit)    Patient is here today to get established with the practice. States she has been having pain on left side that has been going on for about 2 months. Has tried tylenol  which helped but the pain still comes and goes. Also wants to follow up on Vit D and Cholesterol levels.    HPI: Ashley Farley presents today to establish care at [facility] at [facility]. Introduced to Publishing rights manager role and practice setting.  All questions answered.   Last PCP: Stephannie Ehlers  Last annual physical: December 2024  Concerns: See below     HYPERLIPIDEMIA: [name] presents for the medical management of hyperlipidemia.  Patient's current HLD regimen is: Dietary changes and exercise several times a week  Patient is not currently taking prescribed medications for HLD.  Adhering to heathy diet: Yes Exercising regularly: Yes Denies myalgias.  No results found for: "CHOL", "HDL", "LDLCALC", "LDLDIRECT", "TRIG", "CHOLHDL"   VITAMIN D DEFICIENCY: Ashley Farley presents for the medical management of Vitamin D deficiency.  Current regimen: Vitamin D3 1000 unit daily, Multivitamin daily   Complaint with regimen: Yes Up to date DEXA: No  Denies recent falls or injury.  Last vitamin D No results found for: "25OHVITD2", "25OHVITD3", "VD25OH"   ABDOMINAL PAIN: Onset: 5 Months  Location:  LUQ Pain  Description of pain: Dull pain that occurs with activity and at rest Radiation: None Severity: Mild 4-5  Alleviating factors: Tylenol   Aggravating factors:Bending Over Treatments tried: Tylenol    Fever: no Nausea: no Vomiting: no Weight loss: no Decreased appetite: no Diarrhea: no Constipation: no Blood in stool: no Heartburn: no Dysuria/urinary frequency: no Hematuria: no History of sexually transmitted  disease: no Recurrent NSAID use: no   Patient does see Urology Dr. Luster Salters for chronic interstial cystitis. She previously tried Amitryptiline but could not take due to SE. She was tried on Pentosan through compound but could not tolerate. She states she is not taking any meidcation at this time and is attempting to manage with AZO and dietary changes. She does use Nuvaring for contraception. 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 She does not have a regular period while on Nuvaring. No LMP recorded. (Menstrual status: Other). She has a hx of endometriosis with removal of fibroids in the past. She states she is concerned about weight gain as well and difficulty with weight gain postpartum. She is concerned about thyroid disease or insulin resistance contributing to difficulty as she does implement portion control, diet changes and regular exercise.     The following portions of the patient's history were reviewed and updated as appropriate: past medical history, past surgical history, family history, social history, allergies, medications, and problem list.   Patient Active Problem List   Diagnosis Date Noted   Pelvic pain in female 10/22/2023   Mixed hyperlipidemia 10/22/2023   Overweight (BMI 25.0-29.9) 10/22/2023   Vitamin D deficiency 06/18/2014   Anemia 12/26/2013   Pre-eclampsia, severe, delivered 12/23/2013   Preeclampsia 12/16/2013   Twins--di/di 11/26/2013   Short cervix--1.36 on US  today 11/26/2013   Kidney stones--hx 11/26/2013   H/O infertility--IVF pregnancy 11/26/2013   Past Medical History:  Diagnosis Date   Abnormal glucose tolerance test (GTT) during pregnancy, antepartum 10/22/2023   Adjustment disorder with anxiety 06/18/2014   Axillary mass, left  07/31/2016   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   Calculus of kidney 10/22/2023   Carpal tunnel syndrome    Carpal tunnel syndrome 04/08/2017   Cyst of right ovary 02/22/2017   Ultra sound: anteverted uterus: 4.8 x 3.4 x  4.1 cm endometrium: 6.4 mm; right ovary 5.7 cm with a simple 5.3 cm ovarian cyst and left ovary: 2.4 cm     Endometriosis    Endometriosis determined by laparoscopy 04/08/2017   Interstitial cystitis    Interstitial cystitis (chronic) without hematuria 09/03/2016   Kidney stones    Mild persistent asthma without complication 04/08/2017   Multiple acquired skin tags 02/29/2016   Perennial allergic rhinitis with seasonal variation 03/14/2017   Placenta previa 10/22/2023   Posterior - low lying at 22 weeks resolved at 28 weeks     Positive antinuclear antibody 10/22/2023   ASA 81 mg until 36 weeks     Sciatica    Tendonitis of wrist, left    Twin dichorionic diamniotic placenta 10/22/2023   Vitamin D deficiency    Vitamin D deficiency 06/18/2014   Past Surgical History:  Procedure Laterality Date   CESAREAN SECTION N/A 12/23/2013   Procedure: CESAREAN SECTION;  Surgeon: Mckinley Spells, MD;  Location: [facility];  Service: Obstetrics;  Laterality: N/A;   DILATION AND CURETTAGE OF UTERUS     EXCISION OF BREAST LESION Left 12/20/2020   Procedure: EXCISION OF LEFT AXILLARY ACCESORY BREAST TISSUE;  Surgeon: Dareen Ebbing, MD;  Location: [facility];  Service: General;  Laterality: Left;   LAPAROSCOPY  01/22/2011   Procedure: LAPAROSCOPY DIAGNOSTIC;  Surgeon: Ashby Lawman;  Location: [facility];  Service: Gynecology;  Laterality: N/A;   Family History  Problem Relation Age of Onset   Diabetes Mother    Hypertension Mother    Diabetes Father    Hypertension Father    Cancer Father    Lung cancer Father    Stroke Maternal Grandmother    Social History   Socioeconomic History   Marital status: Married    Spouse name: Not on file   Number of children: Not on file   Years of education: Not on file   Highest education level: Not on file  Occupational History   Not on file  Tobacco Use   Smoking status: Never   Smokeless tobacco: Never  Vaping Use   Vaping status: Never  Used  Substance and Sexual Activity   Alcohol use: Yes    Comment: socially   Drug use: No   Sexual activity: Not Currently    Birth control/protection: Inserts    Comment: nuvaring  Other Topics Concern   Not on file  Social History Narrative   Not on file   Social Drivers of Health   Financial Resource Strain: Not on file  Food Insecurity: Low Risk  (07/02/2023)   Received from [facility]   Hunger Vital Sign    Worried About Running Out of Food in the Last Year: Never true    Ran Out of Food in the Last Year: Never true  Transportation Needs: No Transportation Needs (07/02/2023)   Received from [facility]    In the past 12 months, has lack of reliable transportation kept you from medical appointments, meetings, work or from getting things needed for daily living? : No  Physical Activity: Not on file  Stress: Not on file  Social Connections: Unknown (10/12/2021)   [facility]  Social Network    Social Network: Not on file  Intimate Partner Violence: Unknown (09/14/2021)   Received from [place]   HITS    Physically Hurt: Not on file    Insult or Talk Down To: Not on file    Threaten Physical Harm: Not on file    Scream or Curse: Not on file   Outpatient Medications Prior to Visit  Medication Sig Dispense Refill   acetaminophen  (TYLENOL ) 500 MG tablet Take 500 mg by mouth every 6 (six) hours as needed.     albuterol  (PROVENTIL  HFA;VENTOLIN  HFA) 108 (90 Base) MCG/ACT inhaler Inhale 2 puffs into the lungs every 6 (six) hours as needed for wheezing or shortness of breath. 1 Inhaler 6   cetirizine (ZYRTEC) 10 MG tablet Take 10 mg by mouth See admin instructions. Alternates taking with Claritin every other week but only takes every other day     cholecalciferol (VITAMIN D3) 25 MCG (1000 UNIT) tablet Take 1,000 Units by mouth daily.     etonogestrel-ethinyl estradiol (NUVARING) 0.12-0.015 MG/24HR vaginal ring  INSERT 1 RING VAGINALLY AND REMOVE AS DIRECTED BY MD     fluticasone (FLONASE) 50 MCG/ACT nasal spray 1 spray.     fluticasone-salmeterol (ADVAIR) 250-50 MCG/ACT AEPB Inhale 1 puff into the lungs.     triamcinolone  (NASACORT ) 55 MCG/ACT AERO nasal inhaler Place 2 sprays daily into the nose. 1 Inhaler 12   Ascorbic Acid (VITAMIN C) 500 MG CHEW Chew by mouth.     ondansetron  (ZOFRAN  ODT) 4 MG disintegrating tablet Take 1 tablet (4 mg total) by mouth every 8 (eight) hours as needed for nausea or vomiting. 20 tablet 0   traMADol  (ULTRAM ) 50 MG tablet Take 1 tablet (50 mg total) by mouth every 6 (six) hours as needed for moderate pain or severe pain. 12 tablet 0   No facility-administered medications prior to visit.   Allergies  Allergen Reactions   Oxycodone  Hives and Nausea Only   Ibuprofen  Swelling   Other Swelling    Acetaminophen  ok per patient 12/18/2022   Amoxicillin Hives and Nausea And Vomiting    Has patient had a PCN reaction causing immediate rash, facial/tongue/throat swelling, SOB or lightheadedness with hypotension: Yes Has patient had a PCN reaction causing severe rash involving mucus membranes or skin necrosis: No Has patient had a PCN reaction that required hospitalization No Has patient had a PCN reaction occurring within the last 10 years: Yes If all of the above answers are "NO", then may proceed with Cephalosporin use.    Hydrocodone-Acetaminophen  Nausea And Vomiting and Rash   Vicodin [Hydrocodone-Acetaminophen ] Nausea And Vomiting and Rash    ROS: A complete ROS was performed with pertinent positives/negatives noted in the HPI. The remainder of the ROS are negative.   Objective:   Today's Vitals   10/22/23 1029  BP: 111/81  Pulse: 67  SpO2: 98%  Weight: 138 lb 3.2 oz (62.7 kg)  Height: 5' (1.524 m)    GENERAL: Well-appearing, in NAD. Well nourished.  SKIN: Pink, warm and dry.  Head: Normocephalic. NECK: Trachea midline. Full ROM w/o pain or tenderness.   RESPIRATORY: Chest wall symmetrical. Respirations even and non-labored. Breath sounds clear to auscultation bilaterally.  CARDIAC: S1, S2 present, regular rate and rhythm without murmur or gallops. Peripheral pulses 2+ bilaterally.  GI: Abdomen soft, +tenderness present to left pelvic area. NO palpable masses.  Normoactive bowel sounds. No rebound tenderness. No hepatomegaly or splenomegaly. No CVA tenderness.  MSK: Muscle  tone and strength appropriate for age.  NEUROLOGIC: No motor or sensory deficits. Steady, even gait. C2-C12 intact.  PSYCH/MENTAL STATUS: Alert, oriented x 3. Cooperative, appropriate mood and affect.    Results for orders placed or performed in visit on 10/22/23  POCT URINALYSIS DIP (CLINITEK)  Result Value Ref Range   Color, UA yellow yellow   Clarity, UA clear clear   Glucose, UA negative negative mg/dL   Bilirubin, UA negative negative   Ketones, POC UA negative negative mg/dL   Spec Grav, UA 1.610 9.604 - 1.025   Blood, UA moderate (A) negative   pH, UA 6.5 5.0 - 8.0   POC PROTEIN,UA negative negative, trace   Urobilinogen, UA 0.2 0.2 or 1.0 E.U./dL   Nitrite, UA Negative Negative   Leukocytes, UA Negative Negative  POCT urine pregnancy  Result Value Ref Range   Preg Test, Ur Negative Negative       Assessment & Plan:  1. Vitamin D deficiency (Primary) Pt currently taking OTC vitamin D3.  Discussed good dietary sources of vitamin D and calcium  with patient.  Will check level with lab work.  - VITAMIN D 25 Hydroxy (Vit-D Deficiency, Fractures); Future  2. Overweight (BMI 25.0-29.9) Discussed possible causes including metabolism, insulin resistance, thyroid disorder, lack of protein in diet.  Recommend checking A1c and TSH and fasting lab work to rule out possible comorbidities. - Hemoglobin A1c; Future - TSH; Future  3. Pelvic pain in female Concern for possible endometrial flare, fibroid or cyst presents.  Moderate hematuria present in urine dipstick  today, but likely due to to chronic interstitial cystitis.  Will obtain ultrasound pelvis and transvaginal to rule out possible cyst or fibroid presents.  Urine pregnancy negative today. - US  Pelvic Complete With Transvaginal; Future - Comprehensive metabolic panel with GFR; Future - POCT URINALYSIS DIP (CLINITEK) - POCT urine pregnancy  4. Mixed hyperlipidemia Previously well-controlled with diet and exercise.  Will obtain fasting lipid panel to evaluate ASCVD risk. - Lipid panel; Future  5. Other microscopic hematuria Will confirm with urinalysis to [facility].  Likely chronic due to chronic interstitial cystitis.  Will continue with urology management. - Urinalysis, Routine w reflex microscopic      Patient to reach out to office if new, worrisome, or unresolved symptoms arise or if no improvement in patient's condition. Patient verbalized understanding and is agreeable to treatment plan. All questions answered to patient's satisfaction.    Return in about 7 months (around 05/23/2024) for ANNUAL PHYSICAL (fasting labs day of) .    Nonda Bays, [place]

## 2023-10-23 ENCOUNTER — Other Ambulatory Visit (HOSPITAL_COMMUNITY): Payer: Self-pay | Admitting: Family Medicine

## 2023-10-23 ENCOUNTER — Ambulatory Visit (HOSPITAL_BASED_OUTPATIENT_CLINIC_OR_DEPARTMENT_OTHER): Payer: Self-pay | Admitting: Family Medicine

## 2023-10-23 ENCOUNTER — Other Ambulatory Visit (HOSPITAL_BASED_OUTPATIENT_CLINIC_OR_DEPARTMENT_OTHER): Payer: Self-pay | Admitting: Family Medicine

## 2023-10-23 ENCOUNTER — Encounter (HOSPITAL_BASED_OUTPATIENT_CLINIC_OR_DEPARTMENT_OTHER): Payer: Self-pay | Admitting: Family Medicine

## 2023-10-23 LAB — CBC WITH DIFFERENTIAL/PLATELET
Basophils Absolute: 0.1 10*3/uL (ref 0.0–0.2)
Basos: 1 %
EOS (ABSOLUTE): 0.3 10*3/uL (ref 0.0–0.4)
Eos: 3 %
Hematocrit: 39.4 % (ref 34.0–46.6)
Hemoglobin: 13.6 g/dL (ref 11.1–15.9)
Immature Grans (Abs): 0 10*3/uL (ref 0.0–0.1)
Immature Granulocytes: 0 %
Lymphocytes Absolute: 2.9 10*3/uL (ref 0.7–3.1)
Lymphs: 33 %
MCH: 31.7 pg (ref 26.6–33.0)
MCHC: 34.5 g/dL (ref 31.5–35.7)
MCV: 92 fL (ref 79–97)
Monocytes Absolute: 0.5 10*3/uL (ref 0.1–0.9)
Monocytes: 6 %
Neutrophils Absolute: 5.1 10*3/uL (ref 1.4–7.0)
Neutrophils: 57 %
Platelets: 372 10*3/uL (ref 150–450)
RBC: 4.29 x10E6/uL (ref 3.77–5.28)
RDW: 12.8 % (ref 11.7–15.4)
WBC: 8.8 10*3/uL (ref 3.4–10.8)

## 2023-10-23 LAB — MICROSCOPIC EXAMINATION
Bacteria, UA: NONE SEEN
Casts: NONE SEEN /LPF
Epithelial Cells (non renal): NONE SEEN /HPF (ref 0–10)
WBC, UA: NONE SEEN /HPF (ref 0–5)

## 2023-10-23 LAB — URINALYSIS, ROUTINE W REFLEX MICROSCOPIC
Bilirubin, UA: NEGATIVE
Glucose, UA: NEGATIVE
Ketones, UA: NEGATIVE
Leukocytes,UA: NEGATIVE
Nitrite, UA: NEGATIVE
Protein,UA: NEGATIVE
Specific Gravity, UA: 1.019 (ref 1.005–1.030)
Urobilinogen, Ur: 0.2 mg/dL (ref 0.2–1.0)
pH, UA: 6.5 (ref 5.0–7.5)

## 2023-10-23 NOTE — Progress Notes (Signed)
 Patient has chronic hematuria likely due to chronic interstitial cystitis.  No signs of infection on microscopic urinalysis.  No casts, epithelial cells seen on microscopic exam.  Will continue to proceed with urology management and proceed with pelvic ultrasound for patient's concern of pain.

## 2023-10-24 ENCOUNTER — Ambulatory Visit (HOSPITAL_BASED_OUTPATIENT_CLINIC_OR_DEPARTMENT_OTHER): Payer: Self-pay | Admitting: Family Medicine

## 2023-10-24 NOTE — Progress Notes (Signed)
 CBC unremarkable for signs of infection or acute blood loss.

## 2023-10-28 ENCOUNTER — Inpatient Hospital Stay (HOSPITAL_BASED_OUTPATIENT_CLINIC_OR_DEPARTMENT_OTHER): Admission: RE | Admit: 2023-10-28 | Source: Ambulatory Visit | Admitting: Radiology

## 2024-02-24 ENCOUNTER — Encounter

## 2024-05-25 ENCOUNTER — Encounter (HOSPITAL_BASED_OUTPATIENT_CLINIC_OR_DEPARTMENT_OTHER): Admitting: Family Medicine

## 2024-08-03 ENCOUNTER — Ambulatory Visit: Admitting: Physical Therapy
